# Patient Record
Sex: Female | Born: 1975 | Race: Black or African American | Hispanic: No | Marital: Single | Smoking: Never smoker
Health system: Southern US, Community
[De-identification: ages and names within clinical notes are randomized; demographics above are authoritative.]

## PROBLEM LIST (undated history)

## (undated) DIAGNOSIS — F419 Anxiety disorder, unspecified: Secondary | ICD-10-CM

## (undated) DIAGNOSIS — R519 Headache, unspecified: Secondary | ICD-10-CM

## (undated) DIAGNOSIS — K219 Gastro-esophageal reflux disease without esophagitis: Secondary | ICD-10-CM

## (undated) DIAGNOSIS — F32A Depression, unspecified: Secondary | ICD-10-CM

## (undated) DIAGNOSIS — Z87442 Personal history of urinary calculi: Secondary | ICD-10-CM

## (undated) DIAGNOSIS — B009 Herpesviral infection, unspecified: Secondary | ICD-10-CM

## (undated) DIAGNOSIS — R51 Headache: Secondary | ICD-10-CM

## (undated) DIAGNOSIS — D219 Benign neoplasm of connective and other soft tissue, unspecified: Secondary | ICD-10-CM

## (undated) DIAGNOSIS — J302 Other seasonal allergic rhinitis: Secondary | ICD-10-CM

## (undated) DIAGNOSIS — D649 Anemia, unspecified: Secondary | ICD-10-CM

## (undated) DIAGNOSIS — M1990 Unspecified osteoarthritis, unspecified site: Secondary | ICD-10-CM

## (undated) DIAGNOSIS — F329 Major depressive disorder, single episode, unspecified: Secondary | ICD-10-CM

## (undated) HISTORY — DX: Herpesviral infection, unspecified: B00.9

## (undated) HISTORY — DX: Anxiety disorder, unspecified: F41.9

## (undated) HISTORY — DX: Benign neoplasm of connective and other soft tissue, unspecified: D21.9

## (undated) HISTORY — DX: Major depressive disorder, single episode, unspecified: F32.9

## (undated) HISTORY — DX: Depression, unspecified: F32.A

---
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
## 1999-10-12 ENCOUNTER — Encounter: Payer: Self-pay | Admitting: Internal Medicine

## 1999-10-12 ENCOUNTER — Encounter: Admission: RE | Admit: 1999-10-12 | Discharge: 1999-10-12 | Payer: Self-pay | Admitting: Internal Medicine

## 2000-01-27 ENCOUNTER — Ambulatory Visit (HOSPITAL_COMMUNITY): Admission: RE | Admit: 2000-01-27 | Discharge: 2000-01-27 | Payer: Self-pay | Admitting: Family Medicine

## 2000-01-27 ENCOUNTER — Encounter: Payer: Self-pay | Admitting: Family Medicine

## 2000-02-01 ENCOUNTER — Ambulatory Visit (HOSPITAL_COMMUNITY): Admission: RE | Admit: 2000-02-01 | Discharge: 2000-02-01 | Payer: Self-pay | Admitting: Orthopedic Surgery

## 2000-02-01 ENCOUNTER — Encounter: Payer: Self-pay | Admitting: Orthopedic Surgery

## 2000-02-14 ENCOUNTER — Ambulatory Visit (HOSPITAL_BASED_OUTPATIENT_CLINIC_OR_DEPARTMENT_OTHER): Admission: RE | Admit: 2000-02-14 | Discharge: 2000-02-15 | Payer: Self-pay | Admitting: Orthopedic Surgery

## 2000-02-15 HISTORY — PX: KNEE ARTHROSCOPY W/ ACL RECONSTRUCTION AND PATELLA GRAFT: SHX1861

## 2000-12-10 HISTORY — PX: INDUCED ABORTION: SHX677

## 2001-08-20 ENCOUNTER — Emergency Department (HOSPITAL_COMMUNITY): Admission: EM | Admit: 2001-08-20 | Discharge: 2001-08-21 | Payer: Self-pay | Admitting: Emergency Medicine

## 2001-08-21 ENCOUNTER — Encounter: Payer: Self-pay | Admitting: Emergency Medicine

## 2001-11-27 ENCOUNTER — Other Ambulatory Visit: Admission: RE | Admit: 2001-11-27 | Discharge: 2001-11-27 | Payer: Self-pay | Admitting: Gynecology

## 2002-12-09 ENCOUNTER — Other Ambulatory Visit: Admission: RE | Admit: 2002-12-09 | Discharge: 2002-12-09 | Payer: Self-pay | Admitting: Gynecology

## 2003-02-11 ENCOUNTER — Emergency Department (HOSPITAL_COMMUNITY): Admission: EM | Admit: 2003-02-11 | Discharge: 2003-02-11 | Payer: Self-pay | Admitting: Emergency Medicine

## 2003-02-11 ENCOUNTER — Encounter: Payer: Self-pay | Admitting: Emergency Medicine

## 2003-05-03 ENCOUNTER — Other Ambulatory Visit: Admission: RE | Admit: 2003-05-03 | Discharge: 2003-05-03 | Payer: Self-pay | Admitting: Gynecology

## 2004-04-19 ENCOUNTER — Other Ambulatory Visit: Admission: RE | Admit: 2004-04-19 | Discharge: 2004-04-19 | Payer: Self-pay | Admitting: Gynecology

## 2005-04-26 ENCOUNTER — Other Ambulatory Visit: Admission: RE | Admit: 2005-04-26 | Discharge: 2005-04-26 | Payer: Self-pay | Admitting: Gynecology

## 2006-01-03 ENCOUNTER — Inpatient Hospital Stay (HOSPITAL_COMMUNITY): Admission: AD | Admit: 2006-01-03 | Discharge: 2006-01-05 | Payer: Self-pay | Admitting: Gynecology

## 2006-02-13 ENCOUNTER — Other Ambulatory Visit: Admission: RE | Admit: 2006-02-13 | Discharge: 2006-02-13 | Payer: Self-pay | Admitting: Gynecology

## 2007-03-19 ENCOUNTER — Other Ambulatory Visit: Admission: RE | Admit: 2007-03-19 | Discharge: 2007-03-19 | Payer: Self-pay | Admitting: Gynecology

## 2007-07-10 ENCOUNTER — Emergency Department (HOSPITAL_COMMUNITY): Admission: EM | Admit: 2007-07-10 | Discharge: 2007-07-10 | Payer: Self-pay | Admitting: Emergency Medicine

## 2007-09-09 ENCOUNTER — Emergency Department (HOSPITAL_COMMUNITY): Admission: EM | Admit: 2007-09-09 | Discharge: 2007-09-09 | Payer: Self-pay | Admitting: Emergency Medicine

## 2007-09-25 ENCOUNTER — Emergency Department (HOSPITAL_COMMUNITY): Admission: EM | Admit: 2007-09-25 | Discharge: 2007-09-25 | Payer: Self-pay | Admitting: Emergency Medicine

## 2007-10-22 ENCOUNTER — Emergency Department (HOSPITAL_COMMUNITY): Admission: EM | Admit: 2007-10-22 | Discharge: 2007-10-22 | Payer: Self-pay | Admitting: Emergency Medicine

## 2007-10-23 ENCOUNTER — Encounter (INDEPENDENT_AMBULATORY_CARE_PROVIDER_SITE_OTHER): Payer: Self-pay | Admitting: General Surgery

## 2007-10-23 ENCOUNTER — Observation Stay (HOSPITAL_COMMUNITY): Admission: EM | Admit: 2007-10-23 | Discharge: 2007-10-25 | Payer: Self-pay | Admitting: Emergency Medicine

## 2007-10-23 HISTORY — PX: LAPAROSCOPIC CHOLECYSTECTOMY: SUR755

## 2007-10-29 ENCOUNTER — Ambulatory Visit: Payer: Self-pay | Admitting: Internal Medicine

## 2008-03-31 IMAGING — XA DG ERCP WO/W SPHINCTEROTOMY
9 series · 9 of 9 positions shown · non-contrast
Comparison: none

CLINICAL DATA: Cholecystectomy. Possible common bile duct stone.
 ERCP WITH SPHINCTEROTOMY:

[Series 2: dr · 1 of 1 slices shown (1 of 9)]
[im 1/1]
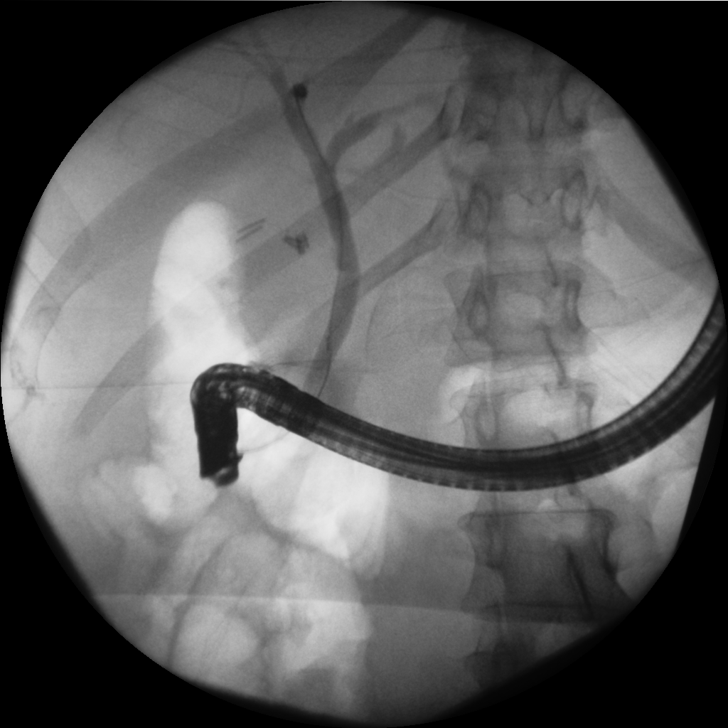

[Series 3: dr · 1 of 1 slices shown (2 of 9)]
[im 1/1]
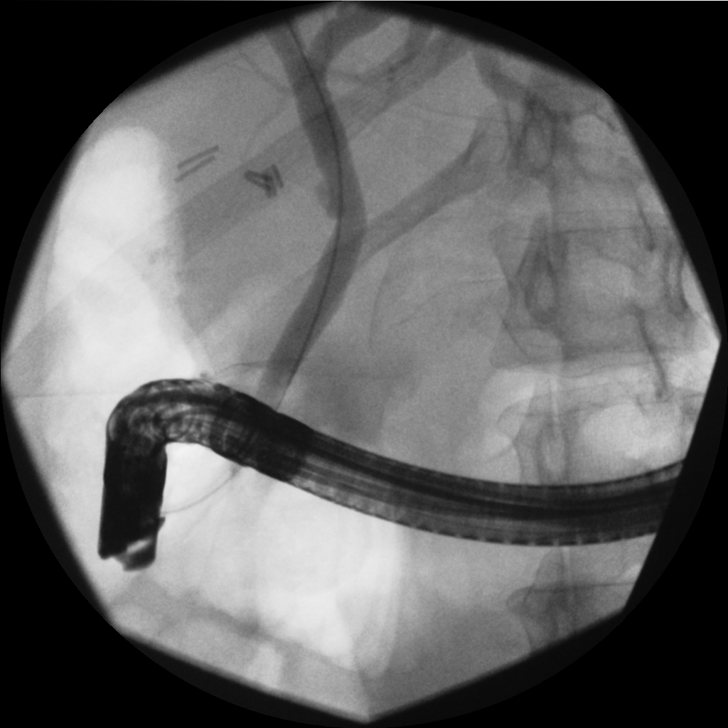

[Series 4: dr · 1 of 1 slices shown (3 of 9)]
[im 1/1]
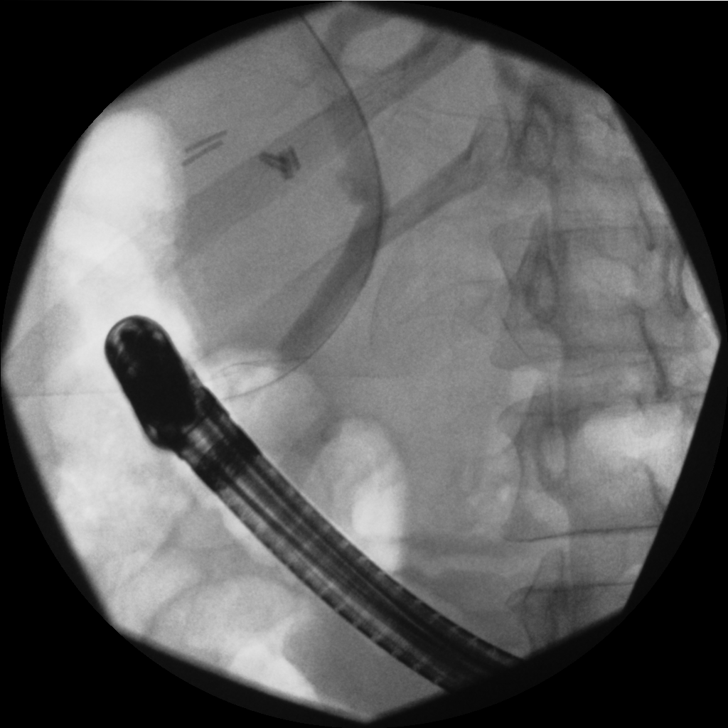

[Series 5: dr · 1 of 1 slices shown (4 of 9)]
[im 1/1]
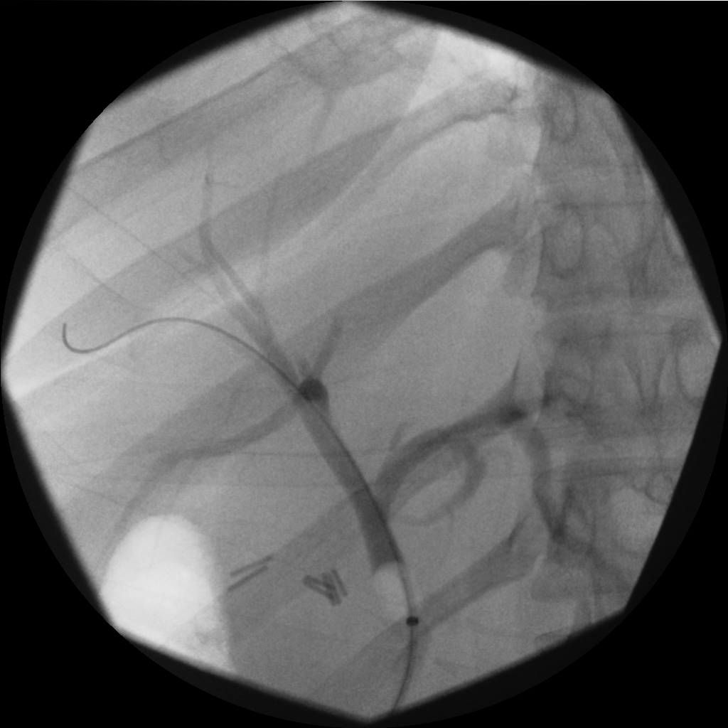

[Series 6: dr · 1 of 1 slices shown (5 of 9)]
[im 1/1]
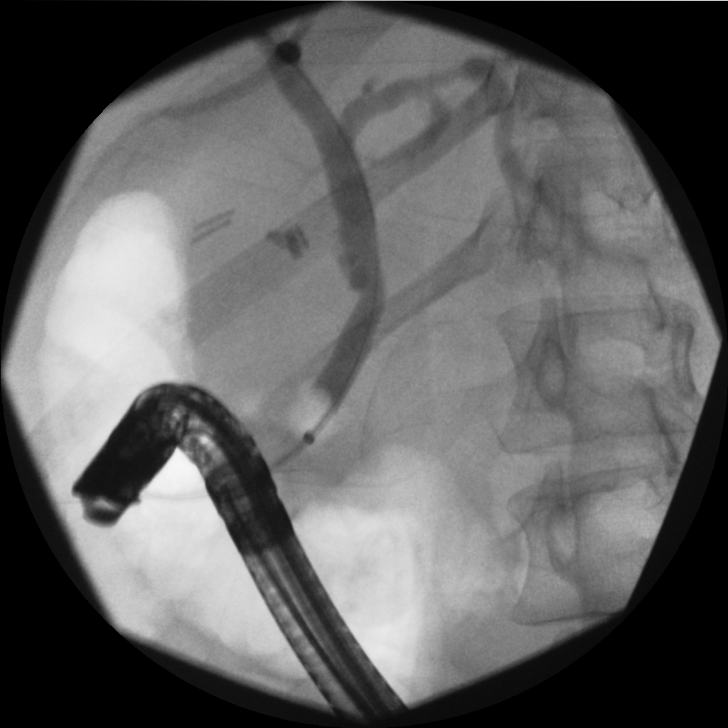

[Series 7: dr · 1 of 1 slices shown (6 of 9)]
[im 1/1]
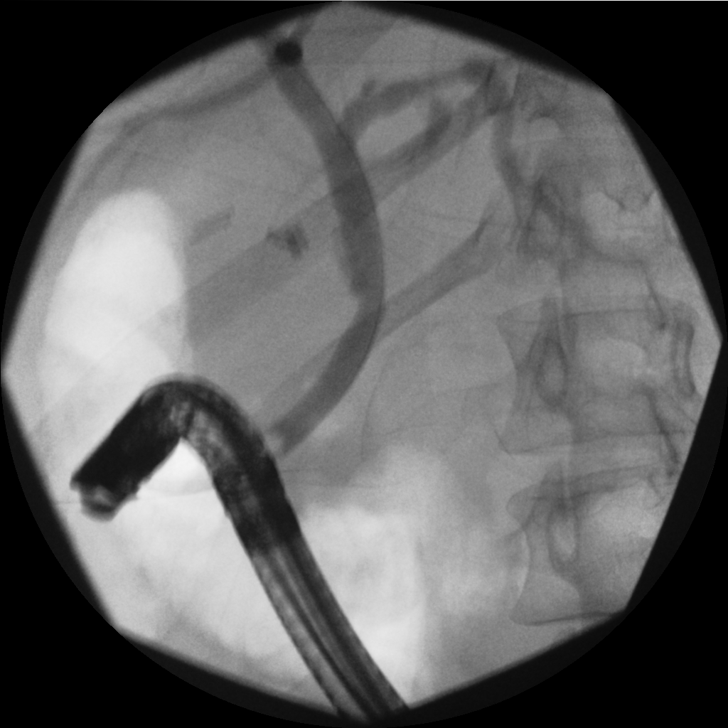

[Series 8: dr · 1 of 1 slices shown (7 of 9)]
[im 1/1]
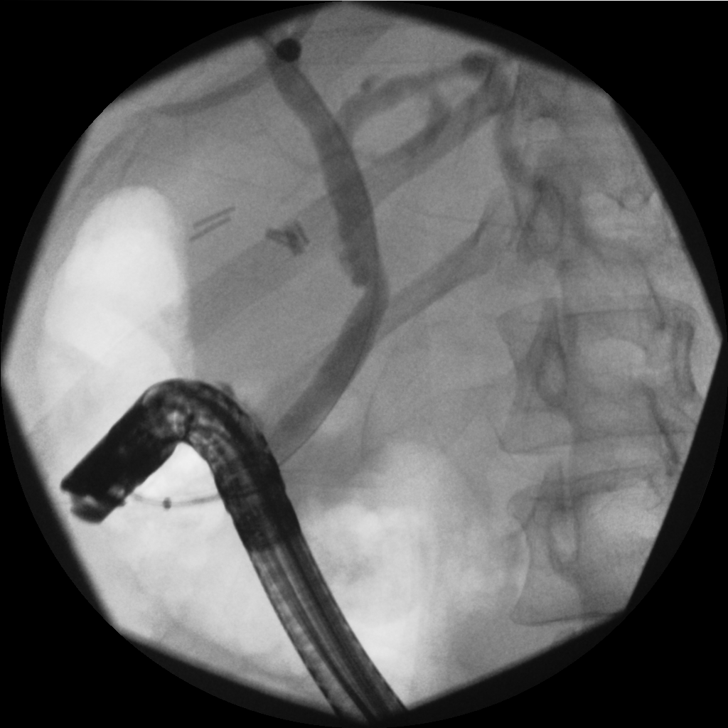

[Series 9: dr · 1 of 1 slices shown (8 of 9)]
[im 1/1]
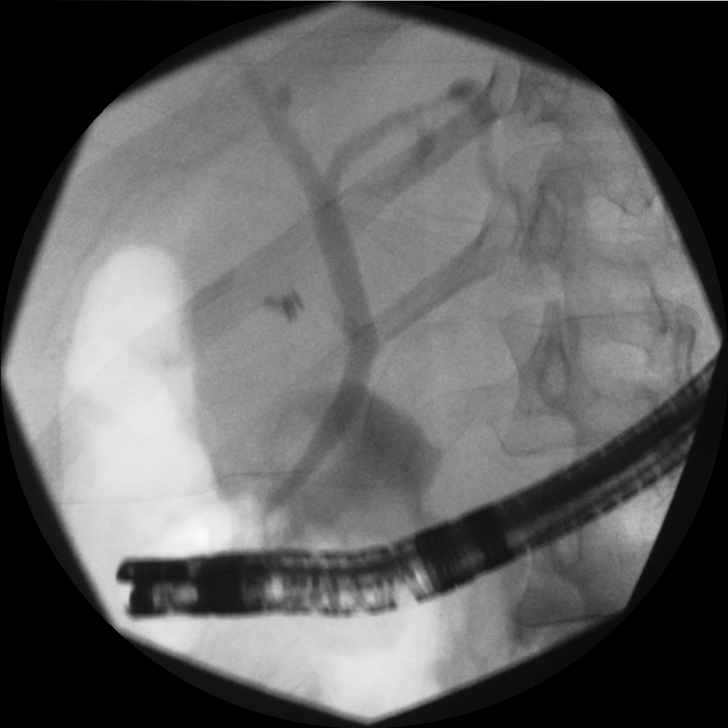

[Series 10: dr · 1 of 1 slices shown (9 of 9)]
[im 1/1]
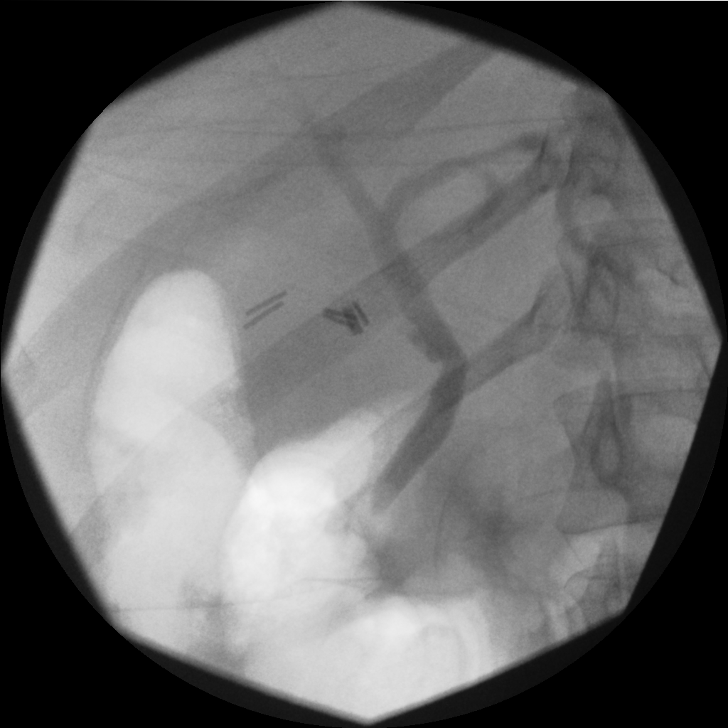

[9 of 9 positions shown; findings below may reference images not displayed]

FINDINGS: Endoscopy was performed by Dr. Andreyx. I was not present for the study.
 Digital C-arm images were obtained. The common bile duct is filled with contrast.  The scope overlies the distal common bile duct. The ducts are nondilated and no filling defects are identified.  Sphincterotomy was performed and a balloon cholangiogram reveals normal biliary caliber without obstruction or filling defect.
IMPRESSION: No definite stones are identified.  Sphincterotomy was performed.

## 2008-04-07 ENCOUNTER — Other Ambulatory Visit: Admission: RE | Admit: 2008-04-07 | Discharge: 2008-04-07 | Payer: Self-pay | Admitting: Gynecology

## 2008-10-12 ENCOUNTER — Ambulatory Visit: Payer: Self-pay | Admitting: Gynecology

## 2008-12-29 ENCOUNTER — Ambulatory Visit: Payer: Self-pay | Admitting: Gynecology

## 2009-02-12 ENCOUNTER — Emergency Department (HOSPITAL_COMMUNITY): Admission: EM | Admit: 2009-02-12 | Discharge: 2009-02-12 | Payer: Self-pay | Admitting: Emergency Medicine

## 2009-04-25 ENCOUNTER — Encounter: Payer: Self-pay | Admitting: Gynecology

## 2009-04-25 ENCOUNTER — Ambulatory Visit: Payer: Self-pay | Admitting: Gynecology

## 2009-04-25 ENCOUNTER — Other Ambulatory Visit: Admission: RE | Admit: 2009-04-25 | Discharge: 2009-04-25 | Payer: Self-pay | Admitting: Gynecology

## 2009-06-24 ENCOUNTER — Ambulatory Visit: Payer: Self-pay | Admitting: Gynecology

## 2009-11-09 ENCOUNTER — Ambulatory Visit: Payer: Self-pay | Admitting: Gynecology

## 2010-05-03 ENCOUNTER — Ambulatory Visit: Payer: Self-pay | Admitting: Gynecology

## 2010-05-03 ENCOUNTER — Other Ambulatory Visit: Admission: RE | Admit: 2010-05-03 | Discharge: 2010-05-03 | Payer: Self-pay | Admitting: Gynecology

## 2010-05-22 ENCOUNTER — Emergency Department (HOSPITAL_COMMUNITY): Admission: EM | Admit: 2010-05-22 | Discharge: 2010-05-22 | Payer: Self-pay | Admitting: Emergency Medicine

## 2010-05-24 ENCOUNTER — Emergency Department (HOSPITAL_COMMUNITY): Admission: EM | Admit: 2010-05-24 | Discharge: 2010-05-24 | Payer: Self-pay | Admitting: Family Medicine

## 2010-07-04 ENCOUNTER — Ambulatory Visit: Payer: Self-pay | Admitting: Gynecology

## 2010-07-12 ENCOUNTER — Ambulatory Visit: Payer: Self-pay | Admitting: Gynecology

## 2010-08-07 ENCOUNTER — Ambulatory Visit: Payer: Self-pay | Admitting: Gynecology

## 2010-09-11 ENCOUNTER — Emergency Department (HOSPITAL_COMMUNITY): Admission: EM | Admit: 2010-09-11 | Discharge: 2010-09-11 | Payer: Self-pay | Admitting: Emergency Medicine

## 2010-11-10 ENCOUNTER — Ambulatory Visit: Payer: Self-pay | Admitting: Gynecology

## 2011-02-21 ENCOUNTER — Ambulatory Visit (INDEPENDENT_AMBULATORY_CARE_PROVIDER_SITE_OTHER): Payer: BC Managed Care – PPO | Admitting: Gynecology

## 2011-02-21 DIAGNOSIS — N898 Other specified noninflammatory disorders of vagina: Secondary | ICD-10-CM

## 2011-02-21 DIAGNOSIS — Z113 Encounter for screening for infections with a predominantly sexual mode of transmission: Secondary | ICD-10-CM

## 2011-02-21 DIAGNOSIS — Z30431 Encounter for routine checking of intrauterine contraceptive device: Secondary | ICD-10-CM

## 2011-02-21 DIAGNOSIS — R82998 Other abnormal findings in urine: Secondary | ICD-10-CM

## 2011-02-21 DIAGNOSIS — B373 Candidiasis of vulva and vagina: Secondary | ICD-10-CM

## 2011-03-22 LAB — COMPREHENSIVE METABOLIC PANEL
ALT: 19 U/L (ref 0–35)
Albumin: 3.4 g/dL — ABNORMAL LOW (ref 3.5–5.2)
Alkaline Phosphatase: 92 U/L (ref 39–117)
BUN: 9 mg/dL (ref 6–23)
CO2: 25 mEq/L (ref 19–32)
Calcium: 8.9 mg/dL (ref 8.4–10.5)
Chloride: 104 mEq/L (ref 96–112)
GFR calc Af Amer: >60 mL/min (ref >60)
Glucose, Bld: 90 mg/dL (ref 70–99)
Total Protein: 7.4 g/dL (ref 6.0–8.3)

## 2011-03-22 LAB — URINALYSIS, ROUTINE W REFLEX MICROSCOPIC
Bilirubin Urine: NEGATIVE
Protein, ur: NEGATIVE mg/dL
Urobilinogen, UA: 0.2 mg/dL (ref 0.0–1.0)
pH: 6 (ref 5.0–8.0)

## 2011-03-29 ENCOUNTER — Emergency Department (HOSPITAL_COMMUNITY): Payer: BC Managed Care – PPO

## 2011-03-29 ENCOUNTER — Emergency Department (HOSPITAL_COMMUNITY)
Admission: EM | Admit: 2011-03-29 | Discharge: 2011-03-29 | Disposition: A | Discharge status: Home / Self Care | Payer: BC Managed Care – PPO | Attending: Emergency Medicine | Admitting: Emergency Medicine

## 2011-03-29 DIAGNOSIS — R071 Chest pain on breathing: Secondary | ICD-10-CM | POA: Insufficient documentation

## 2011-03-29 DIAGNOSIS — Z9089 Acquired absence of other organs: Secondary | ICD-10-CM | POA: Insufficient documentation

## 2011-03-29 DIAGNOSIS — M549 Dorsalgia, unspecified: Secondary | ICD-10-CM | POA: Insufficient documentation

## 2011-03-29 DIAGNOSIS — R10819 Abdominal tenderness, unspecified site: Secondary | ICD-10-CM | POA: Insufficient documentation

## 2011-03-29 LAB — POCT PREGNANCY, URINE: Preg Test, Ur: NEGATIVE

## 2011-03-29 LAB — URINALYSIS, ROUTINE W REFLEX MICROSCOPIC
Glucose, UA: NEGATIVE mg/dL
Hgb urine dipstick: NEGATIVE
Nitrite: NEGATIVE
Specific Gravity, Urine: 1.026 (ref 1.005–1.030)
Urobilinogen, UA: 0.2 mg/dL (ref 0.0–1.0)
pH: 5.5 (ref 5.0–8.0)

## 2011-04-24 NOTE — Consult Note (Signed)
NAME:  TOM, RAGSDALE            ACCOUNT NO.:  [id number]   MEDICAL RECORD NO.:  [id number]          PATIENT TYPE:  OBV   LOCATION:  1538                         FACILITY:  [facility]   PHYSICIAN:  Lennie Muckle, MD      DATE OF BIRTH:  [birth year]   DATE OF CONSULTATION:  10/23/2007  DATE OF DISCHARGE:                                 CONSULTATION   Ms. Garraway is a 35 year old female whom I am seeing by request by Dr.  Preston Fleeting for cholelithiasis and biliary colic.  She is a 35 year old  female, who was known to have cholelithiasis and scheduled for surgery  next Friday with Dr. Marcille Blanco.  Apparently, she had abrupt onset of  abdominal pain yesterday on November 12, was seen in the emergency  department and had resolution of her pain and was discharged home.  She  then ate chicken wings last night and reported to the emergency room at  3:00 in the morning due to severe amount of epigastric discomfort with  radiation of her pain to her back and shoulder blade.  She described  this pain as cramping and sharp.  This has some nausea and vomiting with  this recent episode.  She states she did try to modify her diet and was  watching what she ate but had the episode previously on November 12.  During this recent visit this morning to the emergency room, she did  receive IV Dilaudid, Reglan, and Protonix with almost complete  resolution of her pain.  She would like to see about moving her  operative date forward in order to facilitate removal of her  gallbladder.  She did have labs drawn in the emergency department with  elevation in her liver enzymes.  AST is 299, ALT 144, alkaline  phosphatase 245, and bilirubin is 1.6.  Previous labs drawn from October  8 were essentially within normal limits.   PAST MEDICAL HISTORY:  Migraines and question gastritis.   PAST SURGICAL HISTORY:  Right ACL repair.   MEDICATIONS:  Yasmin, Topamax, and Vicodin.   ALLERGIES:  None.   REVIEW OF SYSTEMS:  Per  the patient and the patient's emergency room  chart which are negative other than her HPI.   PHYSICAL EXAM:  She is a pleasant well-developed young female lying in  stretcher, no acute distress.  Temperature is 97, blood pressure 114/77, pulse 79.  HEENT:  Extraocular muscles are intact.  No scleral icterus is evident.  CHEST:  Clear to auscultation bilaterally.  CARDIOVASCULAR:  Regular rate and rhythm.  ABDOMEN:  Soft, nondistended, minimally tender to palpation in the right  upper quadrant epigastric region.  EXTREMITIES:  Without edema.  SKIN:  No rashes and warm to the touch.   Labs as previously dictated.   ASSESSMENT AND PLAN:  Cholelithiasis with episode of biliary colic,  likely passed a small stone over the past 24 hours.  There is currently  no evidence of acute cholecystitis.  I would like to repeat a liver  panel to ensure that her labs are trending down and that episode is  resolving.  I  attempted to discuss with Dr. Corliss Skains and other partners to  possibly move up her surgery date.  There is no acute need to perform  cholecystectomy; however, she would like to move her surgery date up in  order to refrain from having other episodes of biliary colic.  If her  labs are trending  upward, we may have to have an admission with gastrointestinal consult  for possible ERCP.  Otherwise, we will hopefully be able to discharge  from the emergency room, ensuring her pain is well controlled with  surgery scheduled in the near future.      Lennie Muckle, MD  Electronically Signed     ALA/MEDQ  D:  10/23/2007  T:  10/23/2007  Job:  [id number]

## 2011-04-24 NOTE — Op Note (Signed)
Tracy Bowen, Tracy Bowen            ACCOUNT NO.:  [id number]   MEDICAL RECORD NO.:  [id number]          PATIENT TYPE:  INP   LOCATION:  1538                         FACILITY:  [facility]   PHYSICIAN:  Anselm Pancoast. Weatherly, M.D.DATE OF BIRTH:  Jul 19, 1976   DATE OF PROCEDURE:  10/23/2007  DATE OF DISCHARGE:                               OPERATIVE REPORT   PREOPERATIVE DIAGNOSIS:  Chronic cholecystitis with stones and probable  common duct stone.   POSTOPERATIVE DIAGNOSIS:  Chronic subacute cholecystitis with probable  stone in the distal common bile duct.   OPERATION:  Laparoscopic cholecystectomy with cholangiogram.   SURGEON:  Anselm Pancoast. Zachery Dakins, M.D.   ASSISTANT:  Thornton Park. Daphine Deutscher, MD.   ANESTHESIA:  General.   HISTORY:  Tracy Bowen is a 35 year old female who was seen here in  the emergency room approximately a month ago with episodes of epigastric  pain.  She had had previous episodes of epigastric pain that was  described as reflux.  This time she was having more intense pain and  they evaluated with normal liver function studies, a CBC and then an  ultrasound that showed a number of stones in her gallbladder.  She was  referred to our office and saw Dr. Manus Rudd.  This about 10 days  ago and was  tentatively schedule for laparoscopic cholecystectomy and  cholangiogram on October 31, 2007.  She, however, this morning after  eating chicken wings last evening, had severe episode of epigastric pain  radiating to her back and after the pain persisted.  She came to the  emergency room approximately 1:00 a.m.  The ER physician saw her and  called Lennie Muckle, MD, who was on-call, who came in and saw the  patient.  She had abnormal liver function studies, had a normal white  count and Dr. Freida Busman requested that they repeat the bilirubin and liver  function studies.  This confirmed that there were definitely abnormal.  The bilirubin was 1.8 and the patient had received IV  narcotics for  pain.  Dr. Freida Busman requested that I see the patient since she had other  surgery schedule this morning.  I saw the patient.  She was still having  discomfort and I discussed with her that I feared that she had a common  duct stone, but would recommend that we proceed on with the laparoscopic  cholecystectomy and do a cholangiogram and she possibly or probably  would need an ERCP postoperatively.  The patient was in agreement with  this and was given 3 grams the Unasyn, has PAS stockings and finally got  to the operating room about 5:30 p.m.   The abdomen was prepped after induction of general anesthesia,  endotracheal tube, oral tube to the stomach and then she was draped in  sterile manner.  A small incision was made below the umbilicus.  The  fascia was identified, picked up between two Kochers and very carefully  opened into the peritoneal cavity.  Pursestring suture of 0 Vicryl was  placed and the Hassan cannula introduced.  The gallbladder was  distended.  It was not acutely  inflamed but quite tense and the upper 10  mL trocar was placed under direct vision after anesthetizing the fascia  and subxiphoid area, two lateral 5 mm trocars were placed.  The  gallbladder was grasped, retracted upward and outward.  There was marked  adhesions, both chronic and acute in the proximal portion of the  gallbladder and this very carefully was dissected away from the  gallbladder going down and I could identify the fundus of the  gallbladder and the cystic duct junction and also the cystic artery.  I  doubly clipped the cystic artery proximal, single, distally, did not  divide, placed a clip flush with the junction of the common cystic duct  and gallbladder and made a little x-ray proximally.  There was  significant bile and kind of stone debris, black, not really a true  stone in the cystic duct and then a Cook catheter was introduced and  held in place with clip.  I did an x-ray and  the x-ray shows that the  intrahepatic radicles filled both left and right but there is very  little flow going to the distal portion of the common bile duct.  We  then repositioned her.  The upper 10 mL trocar was kind of partially  occluding the view and after repositioning, etc. Did another injection  and this time you could see the dye going in the proximal portion of the  cystic duct and going down to the ampulla area, but there was really no  flow going into the duodenum.  I then gave her on 1 mg of glucagon,  waited about 5 minutes and repeated the x-ray and this time we could see  just a little dye going into the duodenum and it looks like there is  about a 2 mm stone right at the distal portion of the common bile duct  but the stone gives [place] meniscus but not really the obvious stone.  Dr.  Daphine Deutscher had scrubbed in and reviewed the x-rays and was in agreement with  this and I think what we will do is not try to manipulate with a biliary  Fogarty or anything but just get an x-ray, ERCP if her liver tests are  not back to normal in the a.m.  Dr. Daphine Deutscher was in agreement with this  and we are still waiting for the official reading of the radiologist,  even though this x-ray was completed over 30 minutes ago.  The cystic  duct catheter was removed.  I put three clips on the proximal cystic  duct, divided it and went ahead and divided the cystic artery that had  been previously clipped and then freed the gallbladder from its bed.  The gallbladder was placed in the EndoCatch bag and brought out through  the umbilical port.  We had irrigated, aspirated and could see the  clips, were comfortable that we got good hemostasis and then put a  figure-of-eight suture of 0 Vicryl in addition to the pursestring in the  umbilicus that I had previously placed and anesthetize it for about 10  mL of Marcaine.  The irrigating fluid was aspirated.  No evidence of any  significant bleeding.  The carbon  dioxide released.  The 5 mL ports  withdrawn under direct vision and the carbon dioxide completely released  and the upper 10 trocar withdrawn.  The patient tolerated procedure  well.  The subcutaneous wounds were closed with 4-0 Vicryl.  Benzoin and  Steri-Strips on the  skin.  We will get a set of liver function studies  in the morning and unless these are definitely returning back to normal  and the patient completely asymptomatic, feel that she will need an ERCP  tomorrow.           ______________________________  Anselm Pancoast. Zachery Dakins, M.D.     WJW/MEDQ  D:  10/23/2007  T:  10/24/2007  Job:  [id number]

## 2011-04-24 NOTE — Discharge Summary (Signed)
NAMETASHONNA, Tracy Bowen            ACCOUNT NO.:  [id number]   MEDICAL RECORD NO.:  [id number]          PATIENT TYPE:  INP   LOCATION:  1538                         FACILITY:  [facility]   PHYSICIAN:  Lennie Muckle, MD      DATE OF BIRTH:  1976/09/04   DATE OF ADMISSION:  10/23/2007  DATE OF DISCHARGE:  10/25/2007                               DISCHARGE SUMMARY   Ms. Comins is a 35 year old female who came in with cholelithiasis and  choledocholithiasis.  She was taken to the operating room by Dr. Consuello Bossier on October 23, 2007, for laparoscopic cholecystectomy.  Intraoperative cholangiogram showed some minimal flow into the duodenum  with a probable common bile duct stone.  She did receive a GI consult  with ERCP performed on October 24, 2007.  Stone fragments were  retrieved by Dr. Arlyce Dice.  Postprocedure, she is doing well, having no  significant amounts of abdominal pain, has had clear liquids without  difficulty, is afebrile.  Her incisions are without evidence of  infection.  Abdomen is soft, appropriately tender.  She is scheduled to  eat a low-fat diet this morning and, we will allow her to go home if she  is able to tolerate her diet.  She has been given Percocet for pain and  instructed to take over-the-counter stool softeners to aid with  constipation.  No heavy lifting greater than 20 pounds for 2 weeks.  She  will follow up with Dr. Zachery Dakins in approximately 2 or 3 weeks' time.  She is instructed to wash her incisions daily with soap and water.  No  soaking in bathtub and may eat a regular diet.      Lennie Muckle, MD  Electronically Signed     ALA/MEDQ  D:  10/25/2007  T:  10/26/2007  Job:  [id number]

## 2011-04-24 NOTE — H&P (Signed)
NAMEEMONY, DORMER            ACCOUNT NO.:  [id number]   MEDICAL RECORD NO.:  [id number]          PATIENT TYPE:  OBV   LOCATION:  1538                         FACILITY:  [facility]   PHYSICIAN:  Anselm Pancoast. Weatherly, M.D.DATE OF BIRTH:  [birth year]   DATE OF ADMISSION:  10/23/2007  DATE OF DISCHARGE:                              HISTORY & PHYSICAL   CHIEF COMPLAINT:  Epigastric pain.   HISTORY:  Tracy Bowen is a 35 year old black female who presented  to the ER early a.m. complaining of severe pain after eating chicken  wings earlier last evening.  She has known gallstones.  She was seen  here in the emergency room in October with the gallstones identified.  Her liver function studies were normal at that time and she saw Dr. Fannie Knee  in the office and is tentatively scheduled for surgery on November 21.  She was advised on a low-fat diet, frequent feeding, etc. and she did  not followed this.  With the episode of pain which is epigastric area  kind of going to the upper abdomen and back, she had a mildly abnormal  liver function studies and was seen in the early a.m. by Dr. Freida Busman who  was on call.  Repeat liver function studies definitely show a bilirubin  1.8, SGOT, PT and alkaline phosphatase all abnormal.  She has received  narcotics.  Her pain is definitely not worse than it was earlier and  actually has possibly subsided, but since she has got known gallstones  and has had several attempts here to the ER I think she ought to be  placed on the OR schedule, and the possibility of ERCP is definitely  possible.  The patient is in agreement with this.  She has a past  history of migraine headaches and she is on birth control pills.  She  does not smoke, does not use alcohol   PHYSICAL EXAMINATION:  She is a pleasant, fairly large black female in  no acute distress now.  She has an IV in the right arm, temperature was  97.  Her pulse 79, respirations 17, blood pressure is 114/77.   She is  not acutely ill and she does not appear jaundiced as far as her sclerae.  Her mucous membranes normal.  There is no pharyngitis  LUNGS:  Clear.  CARDIAC:  Normal sinus rhythm.  Did not her breast exam.  ABDOMEN:  She is not acutely tender.  She kind of localizes the pain in  the epigastric area.  She is not tender in the lower abdomen and she is  not throwing up at this time.  EXTREMITIES:  Unremarkable.  CNS: Physiologic.   We will get her admitted and plan to add her to the OR schedule.  It  looks like it is going to be late this evening.   IMPRESSION:  Chronic cholecystitis with possibly common duct stone and  an acute episode of biliary colic past evening.  Her liver tests were  not abnormal when she was seen in the emergency room back in October.  ______________________________  Anselm Pancoast. Zachery Dakins, M.D.     WJW/MEDQ  D:  10/23/2007  T:  10/23/2007  Job:  [id number]

## 2011-04-27 NOTE — Op Note (Signed)
[facility]  Patient:    Tracy Bowen, Tracy Bowen                   MRN: [id number] Proc. Date: 02/15/00 Adm. Date:  [id number] Attending:  Burnard Bunting                           Operative Report  PREOPERATIVE DIAGNOSIS:  Right knee anterior cruciate ligament tear, partial tear posterior horn lateral meniscal.  POSTOPERATIVE DIAGNOSIS:  Right knee anterior cruciate ligament tear, partial tear posterior horn lateral meniscal.  PROCEDURE:  Right knee anterior cruciate ligament reconstruction using epsilateral bone patellar tendon bone graft.  SURGEON:  Graylin Shiver. August Saucer, M.D.  ASSISTANT:  Veverly Fells. Ophelia Charter, M.D.  ANESTHESIA:  General endotracheal.  ESTIMATED BLOOD LOSS:  25 cc.  DRAINS:  Hemovac x 1.  TOURNIQUET TIME:  Approximately 2 hours and 10 minutes at 300 mmHg.  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 Examination and MRI scan consistent with MRI tear.  Patient has symptomatic instability to he knee.  OPERATIVE FINDINGS: 1. Examination under anesthesia, range of motion 3 degrees hyperextension to 130    degrees of flexion with mild effusion.  Excellent patella mobility two quadrants    medially and laterally.  Intact MCL and LCL distressed at both 0 and 30 degrees.    Intact PCL with 6 mm step-off, Lachman with anterior drawer about 4 mm with o    end point and positive pivot-shift. 2. Diagnostic and operative arthroscopy:    - Intact patellofemoral compartment with some grade 1-2 chondromalacia over  proximal and lateral pole of her patella over a 5 x 5 mm area.  Trochlea      entire articular cartilage was intact.    - Intact medial compartment with intact meniscus and articular cartilage.    - Mid substance tear of the ACL, intact PCL.    - Intact lateral compartment with partial thickness tear of posterior horn  lateral  meniscus, which was stable and did not displace into the joint with      probing.  PROCEDURE IN DETAIL:  Patient was brought to the operating room, where general endotracheal anesthesia was induced.  Preoperative IV antibiotics were administered.  Patients right and left legs were examined under anesthesia.  A bump was placed under the right hip and the patient was properly positioned. The right leg was then sprayed with Betadine over the knee, prepped with Duraprep solution and draped in a sterile manner.  A proximal right thigh tourniquet was  placed prior to prepping and draping.  Tracy Bowen was used to cover the operative field. Leg was elevated and exsanguinated with the Esmarch wrap and tourniquet was inflated and incision was made from the inferior pole of the patella to the tibia tubercle.  Skin and subcutaneous tissue were sharply divided.  Paratenon was divided and developed has a separate plane for lateral closure.  The patients patella width measured about 33-34 mm and thus central third patellar tendon measuring 10 mm with double wide knife was harvested.  This was facilitated with the use of oscillating saw and 1/4 inch curved osteotome.  Patella defect was bone grafted.  The patella tendon was closed loosely using interrupted 0 Vicryl figure-of-eight suture inverted.  Paratenon was then reapproximated halfway down the patella.  Soft tissue was then  raised over the proximal/medial tibia in order to facilitate placement of the tibial bone tunnel.  One #5 Ethibond was placed through the nose of what would be the femoral bone plug.  Three #5 Ethibonds were placed through what would be the bone plug on the tibia.  The anterior/inferior  lateral portal and anterior/inferior medial portals were then established. Anterior/inferior medial portal was established under direct visualization. Systemic  examination of the knee was performed.  Suprapatellar pouch was free f loose  bodies.  The medial and lateral gutters had a few very small cartilage flakes in them, which were washed out.  The medial compartment had intact articular cartilage and intact meniscus.  ACL had mid substance tear.  Lateral compartment demonstrated a 5 x 7 mm area of denuded cartilage about grade 1 in thickness over the anterior/lateral femoral condyle in the region of the bone bruise.  There was also a partial thickness tear posterior horn lateral meniscus, which was posterior to the popliteus tendon.  The tear did not displace into the joint.  At this time, the mid substance tear of the ACL was identified and the ACL itself was debrided. The over-the-top position in the back was identified.  Tibial drill guide was then set at 45 degrees and placed onto the tibia.  Guide pin was placed and a 10 mm tunnel was drilled with an acorn reamer.  Guide pin was then placed near the back wall of the femur and a 10 mm tunnel was drilled here with about a 2 mm back wall ensured.  Beef tendon was passed.  The graft was then passed into the knee. Adequate notch plasty was performed, changing the A-shaped notch to a U-shaped notch and removing part of the lateral wall to prevent possible impingement. The graft was passed.  It was then secured with a 7 x 23 mm bioabsorbable interferon screw on the femur.  Anesthesia check was positive for secure fixation.  Knee was taken through a range of motion and was found not to impinge on the lateral or superior notch.  Then secured to the femur using a 9 x 23 mm interferon screw placed on the posterior aspect of the bone block.  The two sutures were then tied over a post distal to the tibial tunnel.  The knee was taken through a range of  motion and the graft tension was rechecked and found to be excellent.  The Lachman was negative.  Incision and the knee joint were then thoroughly irrigated. Consideration was given to repairing the lateral meniscus,  but since it was posterior to the popliteus and was only partial thickness, it was decided to leave this meniscal tear alone.  A Hemovac drain was placed.  The incision was the closed  using 0 Vicryl to repair the portals.  The paratenon was then closed using 0 Vicryl figure-of-eight suture.  Soft tissue coverage was achieved over the tibia tunnel. The skin and subcutaneous tissue was then closed using interrupted 2-0 Vicryl suture, followed by a running 3-0 pullout Prolene.  Ten cc of Marcaine and morphine were placed into the knee joint prior to closure.  The patient was then placed n a bulky knee dressing and knee immobilizer.  The patient tolerated the procedure ell without immediate complications.  Pulse was palpable at the conclusion of the case. DD:  02/15/00 TD:  02/15/00 Job: [id number]

## 2011-04-27 NOTE — H&P (Signed)
Tracy Bowen, Tracy Bowen            ACCOUNT NO.:  [id number]   MEDICAL RECORD NO.:  [id number]          PATIENT TYPE:  INP   LOCATION:  9166                          FACILITY:  [facility]   PHYSICIAN:  Juan H. Lily Peer, M.D.DATE OF BIRTH:  09-20-1976   DATE OF ADMISSION:  01/03/2006  DATE OF DISCHARGE:                                HISTORY & PHYSICAL   CHIEF COMPLAINT:  1.  Post date pregnancy 41-week gestation.  2.  Contractions.   HISTORY:  The patient is a 35 year old gravida 3, para 0, AB2 with a  corrected date of estimated confinement of December 27, 2005 current [redacted] weeks  gestation.  Patient was previously scheduled to be admitted on the evening  of January 28 for induction secondary to post dates, but called last night  as she was having a lot of lower abdominal pressure and contractions.  She  was placed on the monitor and was found to be contracting every four to six  minutes apart and occasional variable decelerations.  She was admitted.  This morning underwent artificial rupture of membranes.  Cervix was 2 and  70% effaced, -3 station.  Clear fluid, although minimal.  Fetal scalp  electrode and IUPC were placed and she will be started on Amnioinfusion  secondary to the mild variables.  Patient's GBS status:  Her GBS culture is  positive.  She will be started on pen G for prophylaxis.  Her prenatal  course significant for the fact that she had declined cystic fibrosis screen  as well as first trimester screen and declined alpha fetoprotein.  Patient  had a hemoglobin electrophoresis that she had slightly elevated hemoglobin  S.  She had bacterial vaginosis in the first trimester and was treated  accordingly.   PAST MEDICAL HISTORY:  Patient denies any allergies.  She denies any other  medical problems.  She has had two elective ABs in 1997 and 2002,  respectively.   REVIEW OF SYSTEMS:  See [place] form.   PHYSICAL EXAMINATION:  VITAL SIGNS:  Patient's blood pressure  initially when  she came in was 142/92, is now 139/65 and she was afebrile.  HEENT:  Unremarkable.  NECK:  Supple.  Trachea midline.  No carotid bruits.  No thyromegaly.  LUNGS:  Clear to auscultation without any rhonchi or wheezes.  HEART:  Regular rate and rhythm.  No murmurs or gallops.  BREASTS:  Not done.  ABDOMEN:  Gravid uterus.  Vertex presentation by [facility] maneuver.  PELVIC:  Cervix 2 cm, 70% effaced, -3 station.  EXTREMITIES:  DTRs 2+.  Trace edema.  No clonus.   PRENATAL LABORATORIES:  Patient declined cystic fibrosis, alpha fetoprotein,  and first trimester screen.  Blood type O+.  Negative antibody screen.  VDRL  was nonreactive.  Rubella immune.  Hepatitis B surface antigen, HIV were  negative.  Diabetes screen was normal and GBS culture was positive.   ASSESSMENT:  Patient is a 35 year old gravida 3, para 2 at [redacted] weeks  gestation.  Had been seen yesterday in the office thinking that she was in  labor.  Reassuring, but not a true reactive  fetal heart rate tracing was  noted so a biophysical profile had been done which was 8/8 and the  ultrasound demonstrated vertex presentation and estimated fetal weight was  recorded 7 pounds 9 ounces.  She was admitted early this morning complaining  of contractions.  She had occasional variable decelerations and she was  started on Amnioinfusion, started with 300 mL of lactated Ringer's followed  by 75 mL/hour.  Her contractions were every three to five minutes apart.  Will augment depending if protracted labor.  Since she is GBS positive she  will be given 5 million units of pen G followed by 2.5 million units q.4h.  Will continue to monitor closely and manage accordingly.  Of note, when she  came in her blood pressure had been elevated.  Pregnancy induced  hypertension laboratories on admission LDH, uric acid, SGOT, SGPT, and LDH  were all normal and her platelet count was 174,000.  Her current blood  pressure was 139/65.  Will  continue to monitor closely.   PLAN:  As per assessment above.      Juan H. Lily Peer, M.D.  Electronically Signed     JHF/MEDQ  D:  01/03/2006  T:  01/03/2006  Job:  [id number]

## 2011-04-27 NOTE — Op Note (Signed)
[place]  Patient:    Tracy Bowen, HARTS                   MRN: [id number] Proc. Date: 02/16/00 Attending:  Burnard Bunting                           Operative Report  PREOPERATIVE DIAGNOSIS:  Retained drain, right knee.  POSTOPERATIVE DIAGNOSIS:  Retained drain, right knee.  PROCEDURE:  Removal of drain, right knee.  SURGEON/ATTENDING PHYSICIAN:  Cammy Copa, M.D.  ANESTHESIA:  General endotracheal.  ESTIMATED BLOOD LOSS:  10 cc.  INDICATIONS:  Tracy Bowen is a patient who had ACL reconstruction performed one day before the current procedure.  Attempts to remove the drain on the morning after surgery were unsuccessful and were causing significant pain to the patient. She was brought to the operating room to have this drain removed.  PROCEDURE IN DETAIL:  The patient was brought to the operating room, where general endotracheal anesthesia was induced.  IV antibiotics were administered. Attempts were made to remove the drain from the superolateral port of the right knee. These attempts were unsuccessful.  The patients right leg and foot were then prepped with DuraPrep and draped in a sterile manner.  Fluoroscopy was then utilized, and it showed a portion of the drain remaining in the center portion of the knee. Before prepping and draping the leg, an attempt was made to remove the drain, and only part of it came out.  The leg was then prepped and draped in a sterile manner. Fluoroscopy did confirm that part of the drain did remain in the fat pad region of the knee.  Her prior incision was then opened in the proximal portion, and the drain was identified, kinked within the fat pad.  Blunt dissection was utilized, and although I looked carefully for a suture adhering the drain to the tissue,  could not find any.  Nonetheless, because of the difficulty in removing the drain, a kink in the drain, or something, must have  been tethering it in position. Nonetheless, the drain was removed.  Fluoroscopy confirmed complete removal of he drain from the joint.  There was a kink in the drain.  The joint itself was then irrigated through that lateral portal within the incision using 2 L of saline.  This was then closed using #0 Vicryl figure-of-eight suture.  The tissue above he patellar tendon closure and below the incision was then irrigated using another  liter of irrigating solution.  The incision was then closed back as it was prior using interrupted 2-0 Vicryl suture and the running 3-0 pull-out Prolene.  The stability was excellent with 0-1 mm anterior translation and firm end-point. Steri-Strips were applied, and a bulky dressing was then placed.  The patient tolerated the procedure well without immediate complications.  The tourniquet was not used. DD:  02/16/00 TD:  02/17/00 Job: [id number]

## 2011-05-09 ENCOUNTER — Encounter (INDEPENDENT_AMBULATORY_CARE_PROVIDER_SITE_OTHER): Payer: BC Managed Care – PPO | Admitting: Gynecology

## 2011-05-09 ENCOUNTER — Other Ambulatory Visit: Payer: Self-pay | Admitting: Gynecology

## 2011-05-09 ENCOUNTER — Other Ambulatory Visit (HOSPITAL_COMMUNITY)
Admission: RE | Admit: 2011-05-09 | Discharge: 2011-05-09 | Disposition: A | Discharge status: Home / Self Care | Payer: BC Managed Care – PPO | Source: Ambulatory Visit | Attending: Gynecology | Admitting: Gynecology

## 2011-05-09 DIAGNOSIS — Z30431 Encounter for routine checking of intrauterine contraceptive device: Secondary | ICD-10-CM

## 2011-05-09 DIAGNOSIS — Z13220 Encounter for screening for lipoid disorders: Secondary | ICD-10-CM

## 2011-05-09 DIAGNOSIS — Z833 Family history of diabetes mellitus: Secondary | ICD-10-CM

## 2011-05-09 DIAGNOSIS — N898 Other specified noninflammatory disorders of vagina: Secondary | ICD-10-CM

## 2011-05-09 DIAGNOSIS — B373 Candidiasis of vulva and vagina: Secondary | ICD-10-CM

## 2011-05-09 DIAGNOSIS — Z124 Encounter for screening for malignant neoplasm of cervix: Secondary | ICD-10-CM | POA: Insufficient documentation

## 2011-05-09 DIAGNOSIS — Z01419 Encounter for gynecological examination (general) (routine) without abnormal findings: Secondary | ICD-10-CM

## 2011-05-09 DIAGNOSIS — L293 Anogenital pruritus, unspecified: Secondary | ICD-10-CM

## 2011-05-09 DIAGNOSIS — R809 Proteinuria, unspecified: Secondary | ICD-10-CM

## 2011-08-14 ENCOUNTER — Other Ambulatory Visit: Payer: Self-pay | Admitting: Gynecology

## 2011-08-31 ENCOUNTER — Other Ambulatory Visit: Payer: Self-pay | Admitting: Women's Health

## 2011-08-31 DIAGNOSIS — B009 Herpesviral infection, unspecified: Secondary | ICD-10-CM

## 2011-09-18 LAB — COMPREHENSIVE METABOLIC PANEL
ALT: 142 — ABNORMAL HIGH
ALT: 144 — ABNORMAL HIGH
AST: 299 — ABNORMAL HIGH
Albumin: 2.7 — ABNORMAL LOW
Alkaline Phosphatase: 237 — ABNORMAL HIGH
Alkaline Phosphatase: 245 — ABNORMAL HIGH
BUN: 2 — ABNORMAL LOW
Chloride: 105
Potassium: 3.6
Potassium: 4.1
Total Bilirubin: 1.6 — ABNORMAL HIGH
Total Bilirubin: 1.9 — ABNORMAL HIGH
Total Protein: 8.4 — ABNORMAL HIGH

## 2011-09-18 LAB — URINE MICROSCOPIC-ADD ON

## 2011-09-18 LAB — URINALYSIS, ROUTINE W REFLEX MICROSCOPIC
Glucose, UA: NEGATIVE
pH: 7

## 2011-09-18 LAB — DIFFERENTIAL
Basophils Relative: 1
Eosinophils Absolute: 0.1 — ABNORMAL LOW
Monocytes Absolute: 0.5
Monocytes Relative: 8

## 2011-09-18 LAB — CBC
HCT: 39.8
Hemoglobin: 13.3
MCHC: 33.3
MCV: 82.6
RBC: 4.82
WBC: 6.3

## 2011-09-18 LAB — ALT: ALT: 140 — ABNORMAL HIGH

## 2011-09-18 LAB — BILIRUBIN, TOTAL: Total Bilirubin: 1.8 — ABNORMAL HIGH

## 2011-09-19 LAB — CBC
HCT: 35.9 — ABNORMAL LOW
Hemoglobin: 11.8 — ABNORMAL LOW
MCHC: 32.9
MCV: 82.8
Platelets: 267
RBC: 4.34
RDW: 14.2 — ABNORMAL HIGH
WBC: 7.5

## 2011-09-19 LAB — URINALYSIS, ROUTINE W REFLEX MICROSCOPIC
Bilirubin Urine: NEGATIVE
Hgb urine dipstick: NEGATIVE
Ketones, ur: NEGATIVE
Protein, ur: NEGATIVE
Urobilinogen, UA: 0.2

## 2011-09-19 LAB — COMPREHENSIVE METABOLIC PANEL WITH GFR
ALT: 16
AST: 24
Albumin: 3.1 — ABNORMAL LOW
Alkaline Phosphatase: 99
BUN: 4 — ABNORMAL LOW
CO2: 24
Calcium: 9.1
Chloride: 102
Creatinine, Ser: 1
GFR calc non Af Amer: >60
Glucose, Bld: 102 — ABNORMAL HIGH
Potassium: 4
Sodium: 135
Total Bilirubin: 0.3
Total Protein: 6.9

## 2011-09-19 LAB — POCT PREGNANCY, URINE
Operator id: 29727
Preg Test, Ur: NEGATIVE

## 2011-09-19 LAB — DIFFERENTIAL
Basophils Relative: 4 — ABNORMAL HIGH
Lymphs Abs: 1.8
Monocytes Relative: 5
Neutro Abs: 4.9
Neutrophils Relative %: 66

## 2011-09-19 LAB — LIPASE, BLOOD: Lipase: 25

## 2011-09-20 LAB — POCT PREGNANCY, URINE
Operator id: 19830
Preg Test, Ur: NEGATIVE

## 2011-09-24 LAB — COMPREHENSIVE METABOLIC PANEL
Albumin: 2.9 — ABNORMAL LOW
Alkaline Phosphatase: 93
BUN: 5 — ABNORMAL LOW
Calcium: 9
Creatinine, Ser: 0.97
Glucose, Bld: 110 — ABNORMAL HIGH
Potassium: 3.9
Total Protein: 6.9

## 2011-09-24 LAB — URINALYSIS, ROUTINE W REFLEX MICROSCOPIC
Bilirubin Urine: NEGATIVE
Nitrite: NEGATIVE
Protein, ur: NEGATIVE
Specific Gravity, Urine: 1.025
Urobilinogen, UA: 0.2

## 2011-09-24 LAB — DIFFERENTIAL
Basophils Relative: 0
Lymphocytes Relative: 32
Lymphs Abs: 1.9
Monocytes Absolute: 0.4
Monocytes Relative: 6
Neutro Abs: 3.5
Neutrophils Relative %: 60

## 2011-09-24 LAB — POCT PREGNANCY, URINE: Operator id: 244461

## 2011-09-24 LAB — CBC
HCT: 32.8 — ABNORMAL LOW
Hemoglobin: 11.1 — ABNORMAL LOW
MCHC: 34
Platelets: 220
RDW: 13.4

## 2011-11-05 ENCOUNTER — Encounter (HOSPITAL_COMMUNITY): Payer: Self-pay

## 2011-11-05 ENCOUNTER — Emergency Department (INDEPENDENT_AMBULATORY_CARE_PROVIDER_SITE_OTHER)
Admission: EM | Admit: 2011-11-05 | Discharge: 2011-11-05 | Disposition: A | Discharge status: Home / Self Care | Payer: BC Managed Care – PPO | Source: Home / Self Care

## 2011-11-05 DIAGNOSIS — N760 Acute vaginitis: Secondary | ICD-10-CM

## 2011-11-05 LAB — WET PREP, GENITAL
Trich, Wet Prep: NONE SEEN
Yeast Wet Prep HPF POC: NONE SEEN

## 2011-11-05 MED ORDER — METRONIDAZOLE 500 MG PO TABS: 500.0000 mg | ORAL_TABLET | Freq: Two times a day (BID) | ORAL | Status: AC

## 2011-11-05 NOTE — ED Provider Notes (Signed)
History     CSN: [id number] Arrival date & time: 11/05/2011  8:13 PM   First MD Initiated Contact with Patient 11/05/11 1957      Chief Complaint  Patient presents with  . Vaginal Discharge    4 day hx of vaginal discharge.  has foul odor.  vaginal itching.      (Consider location/radiation/quality/duration/timing/severity/associated sxs/prior treatment) Patient is a 35 y.o. female presenting with vaginal discharge. The history is provided by the patient.  Vaginal Discharge This is a new problem. The current episode started more than 2 days ago (bad oddorus vaginal discharge. Had unprotected sex about 1 week ago. ). The problem occurs constantly. The problem has not changed since onset.Pertinent negatives include no abdominal pain and no headaches. Associated symptoms comments: Denies pelvic pain fever or chills..    Past Medical History  Diagnosis Date  . HSV-2 infection   . Vaginal delivery 2007    Past Surgical History  Procedure Date  . Knee arthroscopy w/ acl reconstruction 2001  . Gallbladder surgery 2008    Family History  Problem Relation Age of Onset  . Hypertension Mother   . Cancer Maternal Grandmother     LUNG CANCER  . Diabetes Paternal Grandmother     History  Substance Use Topics  . Smoking status: Never Smoker   . Smokeless tobacco: Never Used  . Alcohol Use: No    OB History    Grav Para Term Preterm Abortions TAB SAB Ect Mult Living                  Review of Systems  Constitutional: Positive for activity change.  HENT: Negative.   Gastrointestinal: Negative for abdominal pain.  Genitourinary: Positive for vaginal discharge. Negative for dysuria, frequency, flank pain, genital sores, vaginal pain, pelvic pain and dyspareunia.  Skin: Negative for rash.  Neurological: Negative for headaches.    Allergies  Review of patient's allergies indicates no known allergies.  Home Medications   Current Outpatient Rx  Name Route Sig Dispense  Refill  . FIBER SELECT GUMMIES PO CHEW Oral Chew by mouth.      Marland Kitchen FLUCONAZOLE 150 MG PO TABS  TAKE 1 TABLET BY MOUTH NOW 1 tablet 1  . LEVONORGESTREL 20 MCG/24HR IU IUD Intrauterine 1 each by Intrauterine route once. INSERTED 07/12/10     . METRONIDAZOLE 500 MG PO TABS Oral Take 1 tablet (500 mg total) by mouth 2 (two) times daily. 14 tablet 0  . VALTREX PO Oral Take by mouth.      Marland Kitchen VALTREX 500 MG PO TABS  TAKE AS DIRECTED 30 tablet 1    Dispense as written.    LMP 10/29/2011  Physical Exam  Nursing note and vitals reviewed. Constitutional: She appears well-developed and well-nourished. No distress.  HENT:  Mouth/Throat: Oropharynx is clear and moist. No oropharyngeal exudate.  Eyes: Conjunctivae are normal. Pupils are equal, round, and reactive to light.  Cardiovascular: Normal heart sounds.   Pulmonary/Chest: Breath sounds normal.  Abdominal: Soft. There is no tenderness.  Genitourinary: Uterus normal. Cervix exhibits no motion tenderness. Right adnexum displays no mass and no tenderness. Left adnexum displays no mass and no tenderness. Vaginal discharge found.    Lymphadenopathy:    She has no cervical adenopathy.       Right: No inguinal adenopathy present.       Left: No inguinal adenopathy present.    ED Course  Procedures (including critical care time)  Labs Reviewed - No  data to display No results found.   1. Vaginitis       MDM   Pt. Treated with flagil. GC/ChL samples taken.       Sharin Grave, MD 11/08/11 1135

## 2011-11-05 NOTE — ED Notes (Signed)
Patient given discharge instructions by Dr Tressia Danas, expressed understanding and had all questions answered.  Pt given RX x1

## 2011-11-05 NOTE — ED Notes (Signed)
4 day hx of vaginal discharge.  has foul odor.  vaginal itching.

## 2011-11-06 LAB — GC/CHLAMYDIA PROBE AMP, GENITAL
Chlamydia, DNA Probe: NEGATIVE
GC Probe Amp, Genital: NEGATIVE

## 2011-11-15 ENCOUNTER — Encounter: Payer: Self-pay | Admitting: Gynecology

## 2011-11-15 ENCOUNTER — Ambulatory Visit (INDEPENDENT_AMBULATORY_CARE_PROVIDER_SITE_OTHER): Payer: BC Managed Care – PPO | Admitting: Gynecology

## 2011-11-15 DIAGNOSIS — B373 Candidiasis of vulva and vagina: Secondary | ICD-10-CM

## 2011-11-15 DIAGNOSIS — L293 Anogenital pruritus, unspecified: Secondary | ICD-10-CM

## 2011-11-15 DIAGNOSIS — B3731 Acute candidiasis of vulva and vagina: Secondary | ICD-10-CM

## 2011-11-15 DIAGNOSIS — N898 Other specified noninflammatory disorders of vagina: Secondary | ICD-10-CM

## 2011-11-15 MED ORDER — FLUCONAZOLE 200 MG PO TABS: 200.0000 mg | ORAL_TABLET | Freq: Every day | ORAL | Status: AC

## 2011-11-15 NOTE — Progress Notes (Signed)
Patient presents with a one-day history of vaginal itching and discharge.  Exam with chaperone present External BUS vagina with white discharge KOH wet prep done, cervix normal IUD string visualized, uterus normal size midline mobile nontender, adnexa without masses or tenderness  Assessment and plan: Wet prep is positive for yeast we'll treat with Diflucan 200 daily for 3 days due to some recurrences to see if we cannot eradicate colonization. She is due for her annual this coming May and I reminded her to schedule this.

## 2011-11-15 NOTE — Patient Instructions (Signed)
Take yeast medication as prescribed. Follow up for your annual exam when due in May 2013

## 2011-12-06 ENCOUNTER — Other Ambulatory Visit: Payer: Self-pay | Admitting: Gynecology

## 2011-12-06 MED ORDER — TERCONAZOLE 0.8 % VA CREA: 1.0000 | TOPICAL_CREAM | Freq: Every day | VAGINAL | Status: AC

## 2011-12-06 NOTE — Telephone Encounter (Signed)
Tell patient that if she is having recurrent yeast symptoms we just treated her with Diflucan x3 days and I think we need to go to a stronger cream and recommend Terazol 3 day cream.

## 2011-12-18 ENCOUNTER — Other Ambulatory Visit: Payer: Self-pay | Admitting: Gynecology

## 2011-12-20 ENCOUNTER — Encounter: Payer: Self-pay | Admitting: Gynecology

## 2011-12-20 ENCOUNTER — Ambulatory Visit (INDEPENDENT_AMBULATORY_CARE_PROVIDER_SITE_OTHER): Payer: BC Managed Care – PPO | Admitting: Gynecology

## 2011-12-20 DIAGNOSIS — B009 Herpesviral infection, unspecified: Secondary | ICD-10-CM

## 2011-12-20 DIAGNOSIS — N898 Other specified noninflammatory disorders of vagina: Secondary | ICD-10-CM

## 2011-12-20 DIAGNOSIS — N760 Acute vaginitis: Secondary | ICD-10-CM

## 2011-12-20 DIAGNOSIS — N949 Unspecified condition associated with female genital organs and menstrual cycle: Secondary | ICD-10-CM

## 2011-12-20 DIAGNOSIS — A499 Bacterial infection, unspecified: Secondary | ICD-10-CM

## 2011-12-20 LAB — WET PREP FOR TRICH, YEAST, CLUE

## 2011-12-20 MED ORDER — METRONIDAZOLE 500 MG PO TABS: 500.0000 mg | ORAL_TABLET | Freq: Two times a day (BID) | ORAL | Status: AC

## 2011-12-20 MED ORDER — ACYCLOVIR 400 MG PO TABS: 400.0000 mg | ORAL_TABLET | Freq: Two times a day (BID) | ORAL | Status: AC

## 2011-12-20 NOTE — Progress Notes (Signed)
Patient presents with vaginal discharge with odor. She recently was treated by another physician with [employer] but did not seem to help. Also has was something different for HSV suppression. She is on Valtrex but it cost her over $100 for last month.  Exam with Sherrilyn Rist chaperone present Pelvic: External BUS vagina with heavy white discharge. Cervix grossly normal. Uterus midline mobile nontender. Adnexa without masses or tenderness  Assessment and plan: 1. Vaginal discharge with odor. KOH wet prep is suspicious for BV. We'll treat with Flagyl 500 twice a day x7 days alcohol voidance reviewed. Follow up if symptoms persist or recur. 2. HSV treatment. Valtrex as her expensive. We'll try acyclovir 400 mg generic one by mouth twice a day at the earliest onset of outbreak for 3-5 days. #32 refills given. Daily suppressive options were reviewed but she has only occasional outbreaks and would prefer to use intermittent treatment.

## 2011-12-20 NOTE — Patient Instructions (Signed)
Take Flagyl 500 mg twice daily for one week avoid alcohol. Try acyclovir 400 mg twice daily at early onset of herpes outbreak for several days.

## 2011-12-21 ENCOUNTER — Ambulatory Visit: Payer: BC Managed Care – PPO | Admitting: Gynecology

## 2012-02-13 ENCOUNTER — Telehealth: Payer: Self-pay | Admitting: *Deleted

## 2012-02-13 MED ORDER — FLUCONAZOLE 150 MG PO TABS: 150.0000 mg | ORAL_TABLET | Freq: Once | ORAL | Status: AC

## 2012-02-13 NOTE — Telephone Encounter (Signed)
Pt informed with the below note. 

## 2012-02-13 NOTE — Telephone Encounter (Signed)
Pt calling c/o yeast infection x 1 day itching and burning no discharge. Pt was seen in jan 2013 and treated for BV with flagyl. Pt said can't make OV because her daughter has pink eye with ear infection. Pt would like rx. Please advise

## 2012-02-13 NOTE — Telephone Encounter (Signed)
Diflucan 150 mg x1 dose follow up if symptoms persist

## 2012-02-22 ENCOUNTER — Ambulatory Visit: Payer: BC Managed Care – PPO | Admitting: Internal Medicine

## 2012-02-22 VITALS — BP 129/82 | HR 99 | Temp 98.9°F | Resp 16 | Ht 70.0 in | Wt 277.0 lb

## 2012-02-22 DIAGNOSIS — J019 Acute sinusitis, unspecified: Secondary | ICD-10-CM

## 2012-02-22 MED ORDER — AMOXICILLIN 500 MG PO CAPS: 1000.0000 mg | ORAL_CAPSULE | Freq: Two times a day (BID) | ORAL | Status: AC

## 2012-02-22 MED ORDER — MOMETASONE FUROATE 50 MCG/ACT NA SUSP: 2.0000 | Freq: Every day | NASAL | Status: DC

## 2012-02-22 MED ORDER — FLUCONAZOLE 150 MG PO TABS: ORAL_TABLET | ORAL | Status: AC

## 2012-02-22 NOTE — Progress Notes (Signed)
  Subjective:    Patient ID: Tracy Bowen, female    DOB: 1976-01-12, 36 y.o.   MRN: [id number]  Eye Problem  There is pain in the left eye. This is a new problem. The current episode started in the past 7 days. The problem occurs constantly. The problem has been gradually worsening. There was no injury mechanism. The patient is experiencing no pain. Associated symptoms include a recent URI. Pertinent negatives include no fever or foreign body sensation. She has tried nothing for the symptoms.  URI  This is a new problem. The current episode started 1 to 4 weeks ago. The problem has been unchanged. Associated symptoms include congestion, coughing, headaches, rhinorrhea and sinus pain. Pertinent negatives include no chest pain. The treatment provided no relief.  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  Her left eye this morning was pink and had more drainage crusted than usual.  She has no photophobia or vision changes.  She denies fever or productive cough.  She tells me she has 1-2 sinus infections a year, has never seen an ENT and has not had PE tubes or any ENT procedures. She is currently using her Biagio Quint and is not pregnant. She does have seasonal allergies.   Review of Systems  Constitutional: Negative for fever.  HENT: Positive for congestion and rhinorrhea.   Respiratory: Positive for cough.   Cardiovascular: Negative for chest pain.  Neurological: Positive for headaches.  All other systems reviewed and are negative.       Objective:   Physical Exam  Vitals reviewed. Constitutional: She is oriented to person, place, and time. She appears well-developed and well-nourished.       obese  HENT:  Head: Normocephalic.  Right Ear: External ear normal.  Left Ear: External ear normal.       TM's clear bilaterally.  Left nasal mucosa swollen with thick white drainage.  Maxillary sinus TTP   Eyes: Conjunctivae are normal.  Neck: Neck supple.  Cardiovascular: Normal rate, regular rhythm and normal heart sounds.   Pulmonary/Chest: Effort normal and breath sounds normal. No respiratory distress. She has no wheezes. She has no rales.  Lymphadenopathy:    She has no cervical adenopathy.  Neurological: She is alert and oriented to person, place, and time.  Skin: Skin is warm and dry.  Psychiatric: She has a normal mood and affect. Her behavior is normal.          Assessment & Plan:  Sinusitis:  Amoxicillin 1000 mg BID and Nasonex prescribed.  AVS printed and given to pt.  Extra rest and fluids encouraged.  Diflucan given as pt gets vaginitis following antibiotics.  RTC if not better in 3-4 days.

## 2012-02-22 NOTE — Patient Instructions (Signed)
Take your Amoxicillin twice daily for sinusitis and use the Nasonex as needed for sinus pain.  Get extra rest and plenty of fluids.  RTC if not improved in 3-4 days. Sinusitis Sinuses are air pockets within the bones of your face. The growth of bacteria within a sinus leads to infection. The infection prevents the sinuses from draining. This infection is called sinusitis. SYMPTOMS  There will be different areas of pain depending on which sinuses have become infected.  The maxillary sinuses often produce pain beneath the eyes.   Frontal sinusitis may cause pain in the middle of the forehead and above the eyes.  Other problems (symptoms) include:  Toothaches.   Colored, pus-like (purulent) drainage from the nose.   Swelling, warmth, and tenderness over the sinus areas may be signs of infection.  TREATMENT  Sinusitis is most often determined by an exam.X-rays may be taken. If x-rays have been taken, make sure you obtain your results or find out how you are to obtain them. Your caregiver may give you medications (antibiotics). These are medications that will help kill the bacteria causing the infection. You may also be given a medication (decongestant) that helps to reduce sinus swelling.  HOME CARE INSTRUCTIONS   Only take over-the-counter or prescription medicines for pain, discomfort, or fever as directed by your caregiver.   Drink extra fluids. Fluids help thin the mucus so your sinuses can drain more easily.   Applying either moist heat or ice packs to the sinus areas may help relieve discomfort.   Use saline nasal sprays to help moisten your sinuses. The sprays can be found at your local drugstore.  SEEK IMMEDIATE MEDICAL CARE IF:  You have a fever.   You have increasing pain, severe headaches, or toothache.   You have nausea, vomiting, or drowsiness.   You develop unusual swelling around the face or trouble seeing.  MAKE SURE YOU:   Understand these instructions.   Will  watch your condition.   Will get help right away if you are not doing well or get worse.  Document Released: 11/26/2005 Document Revised: 11/15/2011 Document Reviewed: 06/25/2007 [facility] Patient Information 2012 [place], [place].

## 2012-03-24 ENCOUNTER — Encounter: Payer: Self-pay | Admitting: Gynecology

## 2012-03-24 ENCOUNTER — Encounter: Payer: Self-pay | Admitting: *Deleted

## 2012-03-24 ENCOUNTER — Ambulatory Visit (INDEPENDENT_AMBULATORY_CARE_PROVIDER_SITE_OTHER): Payer: BC Managed Care – PPO | Admitting: Gynecology

## 2012-03-24 DIAGNOSIS — N760 Acute vaginitis: Secondary | ICD-10-CM

## 2012-03-24 DIAGNOSIS — N898 Other specified noninflammatory disorders of vagina: Secondary | ICD-10-CM

## 2012-03-24 DIAGNOSIS — A499 Bacterial infection, unspecified: Secondary | ICD-10-CM

## 2012-03-24 LAB — WET PREP FOR TRICH, YEAST, CLUE: Clue Cells Wet Prep HPF POC: NONE SEEN

## 2012-03-24 MED ORDER — METRONIDAZOLE 500 MG PO TABS: 500.0000 mg | ORAL_TABLET | Freq: Two times a day (BID) | ORAL | Status: AC

## 2012-03-24 MED ORDER — FLUCONAZOLE 150 MG PO TABS: 150.0000 mg | ORAL_TABLET | Freq: Once | ORAL | Status: AC

## 2012-03-24 NOTE — Progress Notes (Signed)
The patient presents complaining of vaginal discharge and itching. She was recently treated for a sinusitis with antibiotic.  Exam share chaperone present Pelvic external BUS vagina normal with white frothy discharge. Cervix normal IUD string visualized. Uterus normal size midline mobile nontender. Adnexa without masses or tenderness.  Assessment and plan: Wet prep is positive for BV. We'll treat with Flagyl 500 twice a day x7 days, alcohol avoidance reviewed. I did add Diflucan 150x1 dose given the itching and recent antibiotic treatment just to cover her for subclinical yeast although it did not show up on the wet prep. Patient is due for her annual exam in May I reminded her to schedule this.

## 2012-03-24 NOTE — Patient Instructions (Signed)
Follow up if discharge or itching persist. Otherwise make appointment for your annual exam in one month.

## 2012-04-30 ENCOUNTER — Telehealth: Payer: Self-pay | Admitting: *Deleted

## 2012-04-30 NOTE — Telephone Encounter (Signed)
Left the below note on pt voicemail. 

## 2012-04-30 NOTE — Telephone Encounter (Signed)
Patient needs office visit for further evaluation

## 2012-04-30 NOTE — Telephone Encounter (Signed)
(  Pt of Dr. Audie Box) pt is calling c/o yeast infection x 3 days now, tried OTC monistat but no relief, itching,white discharge, vaginal irritation. Last ov 03/24/12. Please advise

## 2012-05-01 ENCOUNTER — Ambulatory Visit (INDEPENDENT_AMBULATORY_CARE_PROVIDER_SITE_OTHER): Payer: BC Managed Care – PPO | Admitting: Women's Health

## 2012-05-01 ENCOUNTER — Encounter: Payer: Self-pay | Admitting: Women's Health

## 2012-05-01 DIAGNOSIS — L293 Anogenital pruritus, unspecified: Secondary | ICD-10-CM

## 2012-05-01 DIAGNOSIS — R300 Dysuria: Secondary | ICD-10-CM

## 2012-05-01 DIAGNOSIS — N898 Other specified noninflammatory disorders of vagina: Secondary | ICD-10-CM

## 2012-05-01 LAB — URINALYSIS W MICROSCOPIC + REFLEX CULTURE
Bilirubin Urine: NEGATIVE
Glucose, UA: NEGATIVE mg/dL
Ketones, ur: NEGATIVE mg/dL
Nitrite: NEGATIVE
Specific Gravity, Urine: >1.03 — ABNORMAL HIGH (ref 1.005–1.030)
pH: 5 (ref 5.0–8.0)

## 2012-05-01 LAB — WET PREP FOR TRICH, YEAST, CLUE
Trich, Wet Prep: NONE SEEN
Yeast Wet Prep HPF POC: NONE SEEN

## 2012-05-01 MED ORDER — FLUCONAZOLE 150 MG PO TABS: 150.0000 mg | ORAL_TABLET | Freq: Once | ORAL | Status: AC

## 2012-05-01 NOTE — Patient Instructions (Signed)
Monilial Vaginitis Vaginitis in a soreness, swelling and redness (inflammation) of the vagina and vulva. Monilial vaginitis is not a sexually transmitted infection. CAUSES  Yeast vaginitis is caused by yeast (candida) that is normally found in your vagina. With a yeast infection, the candida has overgrown in number to a point that upsets the chemical balance. SYMPTOMS   White, thick vaginal discharge.   Swelling, itching, redness and irritation of the vagina and possibly the lips of the vagina (vulva).   Burning or painful urination.   Painful intercourse.  DIAGNOSIS  Things that may contribute to monilial vaginitis are:  Postmenopausal and virginal states.   Pregnancy.   Infections.   Being tired, sick or stressed, especially if you had monilial vaginitis in the past.   Diabetes. Good control will help lower the chance.   Birth control pills.   Tight fitting garments.   Using bubble bath, feminine sprays, douches or deodorant tampons.   Taking certain medications that kill germs (antibiotics).   Sporadic recurrence can occur if you become ill.  TREATMENT  Your caregiver will give you medication.  There are several kinds of anti monilial vaginal creams and suppositories specific for monilial vaginitis. For recurrent yeast infections, use a suppository or cream in the vagina 2 times a week, or as directed.   Anti-monilial or steroid cream for the itching or irritation of the vulva may also be used. Get your caregiver's permission.   Painting the vagina with methylene blue solution may help if the monilial cream does not work.   Eating yogurt may help prevent monilial vaginitis.  HOME CARE INSTRUCTIONS   Finish all medication as prescribed.   Do not have sex until treatment is completed or after your caregiver tells you it is okay.   Take warm sitz baths.   Do not douche.   Do not use tampons, especially scented ones.   Wear cotton underwear.   Avoid tight  pants and panty hose.   Tell your sexual partner that you have a yeast infection. They should go to their caregiver if they have symptoms such as mild rash or itching.   Your sexual partner should be treated as well if your infection is difficult to eliminate.   Practice safer sex. Use condoms.   Some vaginal medications cause latex condoms to fail. Vaginal medications that harm condoms are:   Cleocin cream.   Butoconazole (Femstat).   Terconazole (Terazol) vaginal suppository.   Miconazole (Monistat) (may be purchased over the counter).  SEEK MEDICAL CARE IF:   You have a temperature by mouth above 102 F (38.9 C).   The infection is getting worse after 2 days of treatment.   The infection is not getting better after 3 days of treatment.   You develop blisters in or around your vagina.   You develop vaginal bleeding, and it is not your menstrual period.   You have pain when you urinate.   You develop intestinal problems.   You have pain with sexual intercourse.  Document Released: 09/05/2005 Document Revised: 11/15/2011 Document Reviewed: 05/20/2009 [employer] Patient Information 2012 [employer]. 

## 2012-05-01 NOTE — Progress Notes (Signed)
Patient ID: Tracy Bowen, female   DOB: March 04, 1976, 36 y.o.   MRN: [id number] Presents with the complaint of vaginal itching for several days and burning with urination. Denies any pain at the end of the stream of urination. Denies a fever. Amenorrheic on [name]/ August of 2011.  Exam: No CVAT, abdomen soft, no rebound or radiation of pain. External genitalia within normal limits, speculum exam cervix - IUD strings visible, moderate amount of white curdy discharge without an odor. Wet prep negative. UA positive for trace leukocytes many bacteria in 3-6 WBCs.  Symptomatic yeast  Plan: Diflucan 150 by mouth x1 dose. Urine culture pending. Instructed to call if symptoms persist.

## 2012-05-03 LAB — URINE CULTURE: Colony Count: NO GROWTH

## 2012-05-20 ENCOUNTER — Ambulatory Visit (INDEPENDENT_AMBULATORY_CARE_PROVIDER_SITE_OTHER): Payer: BC Managed Care – PPO | Admitting: Gynecology

## 2012-05-20 ENCOUNTER — Encounter: Payer: BC Managed Care – PPO | Admitting: Gynecology

## 2012-05-20 ENCOUNTER — Encounter: Payer: Self-pay | Admitting: Gynecology

## 2012-05-20 VITALS — BP 120/74 | Ht 70.5 in | Wt 274.0 lb

## 2012-05-20 DIAGNOSIS — Z30431 Encounter for routine checking of intrauterine contraceptive device: Secondary | ICD-10-CM

## 2012-05-20 DIAGNOSIS — Z01419 Encounter for gynecological examination (general) (routine) without abnormal findings: Secondary | ICD-10-CM

## 2012-05-20 DIAGNOSIS — B009 Herpesviral infection, unspecified: Secondary | ICD-10-CM

## 2012-05-20 MED ORDER — VALTREX 500 MG PO TABS: 500.0000 mg | ORAL_TABLET | Freq: Two times a day (BID) | ORAL | Status: DC

## 2012-05-20 NOTE — Patient Instructions (Signed)
Follow up in one year for annual gynecologic exam. 

## 2012-05-20 NOTE — Progress Notes (Signed)
Tracy Bowen 08-01-1976 [id number]        36 y.o.  for annual exam.  Doing well  Past medical history,surgical history, medications, allergies, family history and social history were all reviewed and documented in the EPIC chart. ROS:  Was performed and pertinent positives and negatives are included in the history.  Exam: . Sherrilyn Rist chaperone present Filed Vitals:   05/20/12 1008  BP: 120/74   General appearance  Normal Skin grossly normal Head/Neck normal with no cervical or supraclavicular adenopathy thyroid normal Lungs  clear Cardiac RR, without RMG Abdominal  soft, nontender, without masses, organomegaly or hernia Breasts  examined lying and sitting without masses, retractions, discharge or axillary adenopathy. Pelvic  Ext/BUS/vagina  normal   Cervix  normal with IUD string visualized  Uterus  anteverted, normal size, shape and contour, midline and mobile nontender   Adnexa  Without masses or tenderness    Anus and perineum  normal   Rectovaginal  normal sphincter tone without palpated masses or tenderness.    Assessment/Plan:  36 y.o. female for annual exam.    1. [name] placed August 2011. Patient is doing well with scant to absent menses. IUD string visualized. 2. Pap smear. No Pap smear was done today. She has no history of abnormal Pap smears with last normal Pap smear 2012. Discussed less frequent screening per current screening guidelines we'll plan every 3-5 your screen. 3. Breast self. SBE monthly reviewed. 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 Daily suppressive treatment options reviewed and declined. I refilled her Valtrex 500 mg 1 twice a day x5 days with outbreak #30 with one refill. 5. Health maintenance. No blood work was done today as she had a normal glucose lipid profile CBC last year. Patient will see me in a year sooner if she continues well, sooner as needed.    Dara Lords MD, 10:35 AM 05/20/2012

## 2012-05-21 LAB — URINALYSIS W MICROSCOPIC + REFLEX CULTURE
Bacteria, UA: NONE SEEN
Bilirubin Urine: NEGATIVE
Crystals: NONE SEEN
Glucose, UA: NEGATIVE mg/dL
Ketones, ur: NEGATIVE mg/dL
Protein, ur: NEGATIVE mg/dL
Urobilinogen, UA: 0.2 mg/dL (ref 0.0–1.0)

## 2012-06-18 ENCOUNTER — Encounter: Payer: Self-pay | Admitting: Gynecology

## 2012-06-18 ENCOUNTER — Ambulatory Visit (INDEPENDENT_AMBULATORY_CARE_PROVIDER_SITE_OTHER): Payer: BC Managed Care – PPO | Admitting: Gynecology

## 2012-06-18 DIAGNOSIS — L739 Follicular disorder, unspecified: Secondary | ICD-10-CM

## 2012-06-18 DIAGNOSIS — A499 Bacterial infection, unspecified: Secondary | ICD-10-CM

## 2012-06-18 DIAGNOSIS — N898 Other specified noninflammatory disorders of vagina: Secondary | ICD-10-CM

## 2012-06-18 DIAGNOSIS — N899 Noninflammatory disorder of vagina, unspecified: Secondary | ICD-10-CM

## 2012-06-18 DIAGNOSIS — B9689 Other specified bacterial agents as the cause of diseases classified elsewhere: Secondary | ICD-10-CM

## 2012-06-18 DIAGNOSIS — L738 Other specified follicular disorders: Secondary | ICD-10-CM

## 2012-06-18 DIAGNOSIS — N760 Acute vaginitis: Secondary | ICD-10-CM

## 2012-06-18 LAB — WET PREP FOR TRICH, YEAST, CLUE
Clue Cells Wet Prep HPF POC: NONE SEEN
Trich, Wet Prep: NONE SEEN

## 2012-06-18 MED ORDER — METRONIDAZOLE 500 MG PO TABS: 500.0000 mg | ORAL_TABLET | Freq: Two times a day (BID) | ORAL | Status: AC

## 2012-06-18 MED ORDER — BETAMETHASONE DIPROPIONATE AUG 0.05 % EX CREA: TOPICAL_CREAM | Freq: Two times a day (BID) | CUTANEOUS | Status: DC

## 2012-06-18 NOTE — Progress Notes (Signed)
Patient presents with two-day history of vaginal irritation. She used an OTC Monistat ovule with persistence of her symptoms. No real significant discharge or odor. Also notes some irritation in the mons area after waxing. She was concerned that this would be an HSV outbreak.  Exam was Sherrilyn Rist assistant Pelvic: External with folliculitis in multiple areas superior mons. No evidence of ulceration or HSV. BUS vagina with white discharge. Bimanual without masses or tenderness.  Assessment and plan: 1. Wet preps positive for BV. We'll treat with Flagyl 500 twice a day x7 days, alcohol avoidance reviewed. Follow up if symptoms persist or recur. 2. Folliculitis due to waxing. Does not appear to be infectious.  Diprolene 0.05% cream twice daily. Follow up if symptoms persist or recur.

## 2012-06-18 NOTE — Patient Instructions (Signed)
Apply steroid cream to the hairline twice daily. Follow up if symptoms persist or worsen. Take Flagyl medication twice daily for one week. Follow up if vaginal irritation continues. Avoid alcohol taking this medication.

## 2012-06-26 ENCOUNTER — Telehealth: Payer: Self-pay | Admitting: *Deleted

## 2012-06-26 ENCOUNTER — Other Ambulatory Visit: Payer: BC Managed Care – PPO

## 2012-06-26 DIAGNOSIS — N390 Urinary tract infection, site not specified: Secondary | ICD-10-CM

## 2012-06-26 NOTE — Telephone Encounter (Signed)
Pt c/o UTI s/s x 2 days  frequent urination only, no burning, pt was seen on 06/18/12 given medication for BV. Okay for pt to leave U/A? Please advise

## 2012-06-26 NOTE — Telephone Encounter (Signed)
Pt informed with the below note, order placed. 

## 2012-06-26 NOTE — Telephone Encounter (Signed)
Okay to leave specimen

## 2012-06-27 ENCOUNTER — Telehealth: Payer: Self-pay | Admitting: Gynecology

## 2012-06-27 LAB — URINALYSIS W MICROSCOPIC + REFLEX CULTURE
Bilirubin Urine: NEGATIVE
Crystals: NONE SEEN
Glucose, UA: NEGATIVE mg/dL
Ketones, ur: NEGATIVE mg/dL
Specific Gravity, Urine: 1.022 (ref 1.005–1.030)
Squamous Epithelial / LPF: NONE SEEN
Urobilinogen, UA: 0.2 mg/dL (ref 0.0–1.0)
WBC, UA: >50 WBC/hpf — AB (ref <3)

## 2012-06-27 MED ORDER — NITROFURANTOIN MONOHYD MACRO 100 MG PO CAPS: 100.0000 mg | ORAL_CAPSULE | Freq: Two times a day (BID) | ORAL | Status: AC

## 2012-06-27 NOTE — Telephone Encounter (Signed)
Pt informed the below 

## 2012-06-27 NOTE — Telephone Encounter (Signed)
Tell patient that her urine does look like a UTI. We'll treat with Macrobid 100 mg twice a day x7 days

## 2012-07-01 LAB — URINE CULTURE

## 2012-08-25 ENCOUNTER — Ambulatory Visit: Payer: BC Managed Care – PPO

## 2012-08-25 ENCOUNTER — Ambulatory Visit: Payer: BC Managed Care – PPO | Admitting: Family Medicine

## 2012-08-25 VITALS — BP 124/86 | HR 110 | Temp 98.7°F | Resp 16 | Ht 71.0 in | Wt 275.0 lb

## 2012-08-25 DIAGNOSIS — R52 Pain, unspecified: Secondary | ICD-10-CM

## 2012-08-25 DIAGNOSIS — S93409A Sprain of unspecified ligament of unspecified ankle, initial encounter: Secondary | ICD-10-CM

## 2012-08-25 NOTE — Progress Notes (Signed)
  Subjective:    Patient ID: Tracy Bowen, female    DOB: 02-23-1976, 36 y.o.   MRN: [id number]  HPI  Sat night (36 hrs prev) pt fell of the side of a curb and suffered an inversion injury to her Right ankle. Pt prev played baseketball so has a h/o many ankle sprains but no breaks or h/o complete tears.  Has taken some ibuprofen and been icing, resting, and elevating ankle. Gets adequate pain relief w/ ibuprofen.  Was able to walk/limp away after tripping and walk into clinic today.    Review of Systems  Cardiovascular: Positive for leg swelling.  Musculoskeletal: Positive for myalgias, joint swelling, arthralgias and gait problem. Negative for back pain.  Skin: Negative for color change, pallor, rash and wound.  Neurological: Negative for syncope, weakness and numbness.  Hematological: Does not bruise/bleed easily.       Objective:   Physical Exam  Vitals reviewed. Constitutional: She is oriented to person, place, and time. She appears well-developed and well-nourished. No distress.  HENT:  Head: Normocephalic and atraumatic.  Right Ear: External ear normal.  Left Ear: External ear normal.  Pulmonary/Chest: Effort normal.  Musculoskeletal: She exhibits tenderness.       Right ankle: She exhibits decreased range of motion and swelling. She exhibits no ecchymosis, no laceration and normal pulse. tenderness. Lateral malleolus, AITFL, CF ligament, posterior TFL and proximal fibula tenderness found. No medial malleolus and no head of 5th metatarsal tenderness found. Achilles tendon normal.       + squeeze test, negative anterior drawer and talar tilt  Neurological: She is alert and oriented to person, place, and time.  Skin: Skin is warm and dry. No rash noted. She is not diaphoretic. No erythema. No pallor.  Psychiatric: She has a normal mood and affect. Her behavior is normal.        3 view Rt ankle x-ray: No bony abnormality seen. Soft tissue swelling visible. Assessment &  Plan:  1. Grade II ankle sprain - Had aircast applied. Pt felt pain would be controlled with ibuprofen 800mg  tid prn and agrees to proceed w/ RICE.  Wear aircast x 3 wks and RTC if not resolved w/in that time or if worsens.

## 2012-08-26 ENCOUNTER — Other Ambulatory Visit: Payer: Self-pay | Admitting: Gynecology

## 2012-08-26 NOTE — Progress Notes (Signed)
Reviewed and agree.

## 2012-09-09 DIAGNOSIS — D219 Benign neoplasm of connective and other soft tissue, unspecified: Secondary | ICD-10-CM

## 2012-09-09 HISTORY — DX: Benign neoplasm of connective and other soft tissue, unspecified: D21.9

## 2012-09-16 ENCOUNTER — Encounter: Payer: Self-pay | Admitting: Gynecology

## 2012-09-16 ENCOUNTER — Ambulatory Visit (INDEPENDENT_AMBULATORY_CARE_PROVIDER_SITE_OTHER): Payer: BC Managed Care – PPO | Admitting: Gynecology

## 2012-09-16 DIAGNOSIS — N760 Acute vaginitis: Secondary | ICD-10-CM

## 2012-09-16 DIAGNOSIS — B9689 Other specified bacterial agents as the cause of diseases classified elsewhere: Secondary | ICD-10-CM

## 2012-09-16 DIAGNOSIS — A499 Bacterial infection, unspecified: Secondary | ICD-10-CM

## 2012-09-16 DIAGNOSIS — N898 Other specified noninflammatory disorders of vagina: Secondary | ICD-10-CM

## 2012-09-16 LAB — WET PREP FOR TRICH, YEAST, CLUE: Yeast Wet Prep HPF POC: NONE SEEN

## 2012-09-16 MED ORDER — TINIDAZOLE 500 MG PO TABS: 2.0000 g | ORAL_TABLET | Freq: Once | ORAL | Status: DC

## 2012-09-16 NOTE — Progress Notes (Signed)
Patient presents with several day history of vaginal discharge and itching. She used OTC antifungal without relief.  Exam with [facility] assistant Pelvic external BUS vagina with frothy white discharge. Cervix normal, IUD string visualized. Uterus normal size midline mobile nontender. Adnexa without masses or tenderness.  Assessment and plan: 1. Vaginal discharge. Wet prep consistent with BV. We'll treat with Tindamax 2 g daily x2 doses. Alcohol avoidance reviewed. Follow up if symptoms persist or recur. 2. Contraceptive management. Patient wants to proceed with tubal sterilization. I again reviewed the options to include LARC such as IUD continuation and Implanon. I reviewed in general was involved with tubal sterilization to include general anesthesia, laparoscopic trocar placement, Falope ring/cautery back up and the risks of internal organ damage such as bowel bladder ureters vessels and nerves. Patient wants to go ahead and schedule and then she'll represent for a full preoperative consult before hand.

## 2012-09-16 NOTE — Patient Instructions (Addendum)
Take Tindamax medication for pills one day and 4 pills the next day. Avoid alcohol. Office will call you to arrange tubal sterilization.

## 2012-09-26 ENCOUNTER — Encounter: Payer: Self-pay | Admitting: Gynecology

## 2012-09-26 ENCOUNTER — Ambulatory Visit (INDEPENDENT_AMBULATORY_CARE_PROVIDER_SITE_OTHER): Payer: BC Managed Care – PPO | Admitting: Gynecology

## 2012-09-26 DIAGNOSIS — N898 Other specified noninflammatory disorders of vagina: Secondary | ICD-10-CM

## 2012-09-26 DIAGNOSIS — Z302 Encounter for sterilization: Secondary | ICD-10-CM

## 2012-09-26 DIAGNOSIS — L293 Anogenital pruritus, unspecified: Secondary | ICD-10-CM

## 2012-09-26 MED ORDER — FLUCONAZOLE 150 MG PO TABS: 150.0000 mg | ORAL_TABLET | Freq: Once | ORAL | Status: DC

## 2012-09-26 NOTE — Progress Notes (Signed)
Tracy Bowen 12/17/1975 [id number]   Preoperative consult  Chief complaint: desires permanent sterilization  History of present illness: 36 y.o. [id number] current IUDs contraception who desires tubal sterilization. Patient was counseled as to alternatives to include pill, patch and ring, Depo-Provera, Implanon, continued IUD, vasectomy, female sterilization. The patient elects for laparoscopic tubal sterilization.  Past medical history,surgical history, medications, allergies, family history and social history were all reviewed and documented in the EPIC chart. ROS:  Was performed and pertinent positives and negatives are included in the history of present illness.  Exam:  [name] assistant General: well developed, well nourished female, no acute distress HEENT: normal  Lungs: clear to auscultation without wheezing, rales or rhonchi  Cardiac: regular rate without rubs, murmurs or gallops  Abdomen: soft, nontender without masses, guarding, rebound, organomegaly  Pelvic: external bus vagina: normal   Cervix: grossly normal, IUD string visualized  Uterus: normal size, midline and mobile, nontender  Adnexa: without masses or tenderness     Assessment/Plan:  36 y.o. [id number] [name] contraception who desires permanent sterilization with laparoscopic tubal sterilization. I reviewed with involved with the procedure to include the intraoperative postoperative courses, instrumentation, trocar placement, multiple port sites, insufflation, use of Falope-Rings with bipolar cautery back up. Absolute irreversible sterility associated with the procedure was reviewed but she also understands there are failures and the potential for pregnancy in the future. The risks of infection, prolonged antibiotics, incisional complications to include opening and draining of incisions, closure by secondary intention, long-term issues of cosmetics/keloid and hernia formation reviewed. The risk of hemorrhage necessitating  transfusion and the risks of transfusion discussed to include transfusion reaction hepatitis HIB mad cow disease and other unknown entities. The risk of inadvertent injury to internal organs including bowel, bladder, ureters, vessels and nerves, either immediately recognized or delay recognized, necessitating major exploratory reparative surgeries and future reparative surgeries including ostomy formation, bowel resection, bladder repair, ureteral damage repair was all discussed understood and accepted. Patient understands we'll remove her [name] at the time of surgery and that her menses will return to her regular pattern after the tubal sterilization. The issues of "post tubal syndrome" were reviewed and the potential for heavier more irregular menses discussed. Patient's questions were answered to satisfaction and she is ready to proceed with surgery.    Dara Lords MD, 10:28 AM 09/26/2012

## 2012-09-26 NOTE — H&P (Signed)
  Tracy Bowen 12/08/1976 [id number]   History and Physical  Chief complaint: desires permanent sterilization  History of present illness: 36 y.o. [id number] current IUDs contraception who desires tubal sterilization. Patient was counseled as to alternatives to include pill, patch and ring, Depo-Provera, Implanon, continued IUD, vasectomy, female sterilization. The patient elects for laparoscopic tubal sterilization.  Past medical history,surgical history, medications, allergies, family history and social history were all reviewed and documented in the EPIC chart. ROS:  Was performed and pertinent positives and negatives are included in the history of present illness.  Exam:  [name] assistant General: well developed, well nourished female, no acute distress HEENT: normal  Lungs: clear to auscultation without wheezing, rales or rhonchi  Cardiac: regular rate without rubs, murmurs or gallops  Abdomen: soft, nontender without masses, guarding, rebound, organomegaly  Pelvic: external bus vagina: normal   Cervix: grossly normal, IUD string visualized  Uterus: normal size, midline and mobile, nontender  Adnexa: without masses or tenderness     Assessment/Plan:  36 y.o. [id number] [name] contraception who desires permanent sterilization with laparoscopic tubal sterilization. I reviewed with involved with the procedure to include the intraoperative postoperative courses, instrumentation, trocar placement, multiple port sites, insufflation, use of Falope-Rings with bipolar cautery back up. Absolute irreversible sterility associated with the procedure was reviewed but she also understands there are failures and the potential for pregnancy in the future. The risks of infection, prolonged antibiotics, incisional complications to include opening and draining of incisions, closure by secondary intention, long-term issues of cosmetics/keloid and hernia formation reviewed. The risk of hemorrhage  necessitating transfusion and the risks of transfusion discussed to include transfusion reaction hepatitis HIB mad cow disease and other unknown entities. The risk of inadvertent injury to internal organs including bowel, bladder, ureters, vessels and nerves, either immediately recognized or delay recognized, necessitating major exploratory reparative surgeries and future reparative surgeries including ostomy formation, bowel resection, bladder repair, ureteral damage repair was all discussed understood and accepted. Patient understands we'll remove her [name] at the time of surgery and that her menses will return to her regular pattern after the tubal sterilization. The issues of "post tubal syndrome" were reviewed and the potential for heavier more irregular menses discussed. Patient's questions were answered to satisfaction and she is ready to proceed with surgery.     Dara Lords MD, 10:35 AM 09/26/2012

## 2012-09-26 NOTE — Patient Instructions (Addendum)
Followup for surgery as scheduled. 

## 2012-09-29 ENCOUNTER — Encounter (HOSPITAL_BASED_OUTPATIENT_CLINIC_OR_DEPARTMENT_OTHER): Payer: Self-pay | Admitting: *Deleted

## 2012-09-29 NOTE — Progress Notes (Signed)
NPO AFTER MN. ARRIVES AT 0615. PT TO GET LABS DONE PRIOR TO DOS (SHE STATES ON Tuesday AM 09-30-2012).

## 2012-09-30 ENCOUNTER — Ambulatory Visit (INDEPENDENT_AMBULATORY_CARE_PROVIDER_SITE_OTHER): Payer: BC Managed Care – PPO | Admitting: Internal Medicine

## 2012-09-30 VITALS — BP 132/86 | HR 80 | Temp 98.1°F | Resp 16 | Ht 71.0 in | Wt 276.0 lb

## 2012-09-30 DIAGNOSIS — Z23 Encounter for immunization: Secondary | ICD-10-CM

## 2012-09-30 DIAGNOSIS — Z0000 Encounter for general adult medical examination without abnormal findings: Secondary | ICD-10-CM

## 2012-09-30 LAB — CBC
HCT: 38.3 % (ref 36.0–46.0)
Hemoglobin: 12.8 g/dL (ref 12.0–15.0)
MCH: 28.8 pg (ref 26.0–34.0)
MCHC: 33.4 g/dL (ref 30.0–36.0)

## 2012-09-30 NOTE — Progress Notes (Signed)
  Tuberculosis Risk Questionnaire  1. Were you born outside the [place] in one of the following parts of the world:    [place], [place], [place], [place] or [place]?  No  2. Have you traveled outside the [place] and lived for more than one month in one of the following parts of the world:  [place], [place], [place], [place] or [place]?  No  3. Do you have a compromised immune system such as from any of the following conditions:  HIV/AIDS, organ or bone marrow transplantation, diabetes, immunosuppressive   medicines (e.g. Prednisone, Remicaide), leukemia, lymphoma, cancer of the   head or neck, gastrectomy or jejunal bypass, end-stage renal disease (on   dialysis), or silicosis?  No    4. Have you ever done one of the following:    Used crack cocaine, injected illegal drugs, worked or resided in jail or prison,   worked or resided at a homeless shelter, or worked as a healthcare worker in   direct contact with patients?  No  5. Have you ever been exposed to anyone with infectious tuberculosis?  No Tuberculosis Symptom Questionnaire  Do you currently have any of the following symptoms?  1. Unexplained cough lasting more than 3 weeks? No  Unexplained fever lasting more than 3 weeks. No   3. Night Sweats (sweating that leaves the bedclothes and sheets wet)   No  4. Shortness of Breath No  5. Chest Pain No  6. Unintentional weight loss  No  7. Unexplained fatigue (very tired for no reason) No 

## 2012-09-30 NOTE — Patient Instructions (Addendum)

## 2012-09-30 NOTE — Progress Notes (Signed)
  Subjective:    Patient ID: ROSAMAE ROCQUE, female    DOB: 1976-05-13, 36 y.o.   MRN: [id number]  HPI Healthy hx Overweight/hx of acl 2001   Review of Systems  Constitutional: Negative.   HENT: Negative.   Eyes: Negative.   Respiratory: Negative.   Cardiovascular: Negative.   Gastrointestinal: Negative.   Genitourinary: Negative.   Musculoskeletal: Positive for arthralgias.  Neurological: Negative.   Hematological: Negative.   Psychiatric/Behavioral: Negative.        Objective:   Physical Exam  Constitutional: She is oriented to person, place, and time. She appears well-nourished. No distress.  HENT:  Right Ear: External ear normal.  Left Ear: External ear normal.  Nose: Nose normal.  Mouth/Throat: Oropharynx is clear and moist.  Eyes: EOM are normal. Pupils are equal, round, and reactive to light.  Neck: Normal range of motion. Neck supple. No thyromegaly present.  Cardiovascular: Normal rate, regular rhythm and normal heart sounds.   Pulmonary/Chest: Effort normal and breath sounds normal.  Abdominal: Soft. Bowel sounds are normal.  Musculoskeletal: She exhibits tenderness.  Neurological: She is alert and oriented to person, place, and time. She has normal reflexes. No cranial nerve deficit. She exhibits normal muscle tone. Coordination normal.  Skin: Skin is warm and dry.  Psychiatric: She has a normal mood and affect.          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

## 2012-10-03 ENCOUNTER — Ambulatory Visit (HOSPITAL_BASED_OUTPATIENT_CLINIC_OR_DEPARTMENT_OTHER)
Admission: RE | Admit: 2012-10-03 | Discharge: 2012-10-03 | Disposition: A | Discharge status: Home / Self Care | Payer: BC Managed Care – PPO | Source: Ambulatory Visit | Attending: Gynecology | Admitting: Gynecology

## 2012-10-03 ENCOUNTER — Encounter (HOSPITAL_BASED_OUTPATIENT_CLINIC_OR_DEPARTMENT_OTHER)
Admission: RE | Disposition: A | Discharge status: Home / Self Care | Payer: Self-pay | Source: Ambulatory Visit | Attending: Gynecology

## 2012-10-03 ENCOUNTER — Encounter (HOSPITAL_BASED_OUTPATIENT_CLINIC_OR_DEPARTMENT_OTHER): Payer: Self-pay | Admitting: *Deleted

## 2012-10-03 ENCOUNTER — Ambulatory Visit (HOSPITAL_BASED_OUTPATIENT_CLINIC_OR_DEPARTMENT_OTHER): Payer: BC Managed Care – PPO | Admitting: Anesthesiology

## 2012-10-03 ENCOUNTER — Encounter (HOSPITAL_BASED_OUTPATIENT_CLINIC_OR_DEPARTMENT_OTHER): Payer: Self-pay | Admitting: Anesthesiology

## 2012-10-03 DIAGNOSIS — D252 Subserosal leiomyoma of uterus: Secondary | ICD-10-CM | POA: Insufficient documentation

## 2012-10-03 DIAGNOSIS — Z302 Encounter for sterilization: Secondary | ICD-10-CM | POA: Insufficient documentation

## 2012-10-03 HISTORY — DX: Gastro-esophageal reflux disease without esophagitis: K21.9

## 2012-10-03 HISTORY — DX: Other seasonal allergic rhinitis: J30.2

## 2012-10-03 HISTORY — PX: TUBAL LIGATION: SHX77

## 2012-10-03 SURGERY — LIGATION, FALLOPIAN TUBE, BILATERAL
Anesthesia: General | Site: Abdomen | Laterality: Bilateral | Wound class: Clean

## 2012-10-03 MED ORDER — LACTATED RINGERS IV SOLN: INTRAVENOUS | Status: DC

## 2012-10-03 MED ORDER — DEXAMETHASONE SODIUM PHOSPHATE 4 MG/ML IJ SOLN
INTRAMUSCULAR | Status: DC | PRN
Start: 2012-10-03 — End: 2012-10-03
  Administered 2012-10-03: 8 mg via INTRAVENOUS

## 2012-10-03 MED ORDER — ONDANSETRON HCL 4 MG/2ML IJ SOLN
INTRAMUSCULAR | Status: DC | PRN
  Administered 2012-10-03: 4 mg via INTRAVENOUS

## 2012-10-03 MED ORDER — OXYCODONE-ACETAMINOPHEN 5-325 MG PO TABS
1.0000 | ORAL_TABLET | Freq: Four times a day (QID) | ORAL | Status: AC | PRN
  Administered 2012-10-03: 1 via ORAL

## 2012-10-03 MED ORDER — LIDOCAINE HCL (CARDIAC) 20 MG/ML IV SOLN
INTRAVENOUS | Status: DC | PRN
  Administered 2012-10-03: 80 mg via INTRAVENOUS

## 2012-10-03 MED ORDER — OXYCODONE-ACETAMINOPHEN 5-325 MG PO TABS
2.0000 | ORAL_TABLET | Freq: Four times a day (QID) | ORAL | Status: DC | PRN
Start: 2012-10-03 — End: 2013-01-19

## 2012-10-03 MED ORDER — PROMETHAZINE HCL 25 MG/ML IJ SOLN: 6.2500 mg | INTRAMUSCULAR | Status: DC | PRN

## 2012-10-03 MED ORDER — BUPIVACAINE HCL (PF) 0.25 % IJ SOLN
INTRAMUSCULAR | Status: DC | PRN
  Administered 2012-10-03: 5 mL

## 2012-10-03 MED ORDER — DEXTROSE 5 % IV SOLN: 2.0000 g | INTRAVENOUS | Status: DC

## 2012-10-03 MED ORDER — MIDAZOLAM HCL 5 MG/5ML IJ SOLN
INTRAMUSCULAR | Status: DC | PRN
  Administered 2012-10-03: 2 mg via INTRAVENOUS

## 2012-10-03 MED ORDER — MEPERIDINE HCL 25 MG/ML IJ SOLN: 6.2500 mg | INTRAMUSCULAR | Status: DC | PRN

## 2012-10-03 MED ORDER — PROPOFOL 10 MG/ML IV BOLUS
INTRAVENOUS | Status: DC | PRN
  Administered 2012-10-03: 200 mg via INTRAVENOUS

## 2012-10-03 MED ORDER — FENTANYL CITRATE 0.05 MG/ML IJ SOLN
INTRAMUSCULAR | Status: DC | PRN
  Administered 2012-10-03 (×4): 50 ug via INTRAVENOUS

## 2012-10-03 MED ORDER — FENTANYL CITRATE 0.05 MG/ML IJ SOLN
25.0000 ug | INTRAMUSCULAR | Status: DC | PRN
  Administered 2012-10-03: 50 ug via INTRAVENOUS
  Administered 2012-10-03 (×2): 25 ug via INTRAVENOUS

## 2012-10-03 MED ORDER — DEXTROSE 5 % IV SOLN
2.0000 g | INTRAVENOUS | Status: DC | PRN
  Administered 2012-10-03: 2 g via INTRAVENOUS

## 2012-10-03 MED ORDER — GLYCOPYRROLATE 0.2 MG/ML IJ SOLN
INTRAMUSCULAR | Status: DC | PRN
Start: 2012-10-03 — End: 2012-10-03
  Administered 2012-10-03: 0.6 mg via INTRAVENOUS

## 2012-10-03 MED ORDER — DEXTROSE IN LACTATED RINGERS 5 % IV SOLN: INTRAVENOUS | Status: DC

## 2012-10-03 MED ORDER — KETOROLAC TROMETHAMINE 30 MG/ML IJ SOLN
INTRAMUSCULAR | Status: DC | PRN
  Administered 2012-10-03: 30 mg via INTRAVENOUS

## 2012-10-03 MED ORDER — ROCURONIUM BROMIDE 100 MG/10ML IV SOLN
INTRAVENOUS | Status: DC | PRN
  Administered 2012-10-03: 30 mg via INTRAVENOUS

## 2012-10-03 MED ORDER — SUCCINYLCHOLINE CHLORIDE 20 MG/ML IJ SOLN
INTRAMUSCULAR | Status: DC | PRN
Start: 2012-10-03 — End: 2012-10-03
  Administered 2012-10-03: 140 mg via INTRAVENOUS

## 2012-10-03 MED ORDER — LACTATED RINGERS IV SOLN
INTRAVENOUS | Status: DC
  Administered 2012-10-03: 100 mL/h via INTRAVENOUS

## 2012-10-03 MED ORDER — NEOSTIGMINE METHYLSULFATE 1 MG/ML IJ SOLN
INTRAMUSCULAR | Status: DC | PRN
  Administered 2012-10-03: 5 mg via INTRAVENOUS

## 2012-10-03 SURGICAL SUPPLY — 46 items
APPLICATOR COTTON TIP 6IN STRL (MISCELLANEOUS) ×2 IMPLANT
BAG URINE DRAINAGE (UROLOGICAL SUPPLIES) IMPLANT
BANDAGE ADHESIVE 1X3 (GAUZE/BANDAGES/DRESSINGS) IMPLANT
BLADE SURG 11 STRL SS (BLADE) ×2 IMPLANT
CATH FOLEY 2WAY SLVR  5CC 16FR (CATHETERS)
CATH FOLEY 2WAY SLVR 5CC 16FR (CATHETERS) IMPLANT
CATH ROBINSON RED A/P 16FR (CATHETERS) ×2 IMPLANT
CLOTH BEACON ORANGE TIMEOUT ST (SAFETY) ×2 IMPLANT
DERMABOND ADVANCED (GAUZE/BANDAGES/DRESSINGS)
DERMABOND ADVANCED .7 DNX12 (GAUZE/BANDAGES/DRESSINGS) IMPLANT
DRAPE CAMERA CLOSED 9X96 (DRAPES) ×2 IMPLANT
DRAPE UNDERBUTTOCKS STRL (DRAPE) ×2 IMPLANT
DRESSING TELFA 8X3 (GAUZE/BANDAGES/DRESSINGS) IMPLANT
ELECT REM PT RETURN 9FT ADLT (ELECTROSURGICAL) ×2
ELECTRODE REM PT RTRN 9FT ADLT (ELECTROSURGICAL) ×1 IMPLANT
FORCEPS BIOP CUTTING (INSTRUMENTS) IMPLANT
GLOVE BIO SURGEON STRL SZ 6.5 (GLOVE) ×2 IMPLANT
GLOVE BIO SURGEON STRL SZ7.5 (GLOVE) ×4 IMPLANT
GLOVE ECLIPSE 6.0 STRL STRAW (GLOVE) ×2 IMPLANT
GOWN PREVENTION PLUS LG XLONG (DISPOSABLE) ×2 IMPLANT
GOWN STRL REIN XL XLG (GOWN DISPOSABLE) ×2 IMPLANT
NEEDLE HYPO 25X1 1.5 SAFETY (NEEDLE) ×2 IMPLANT
NEEDLE INSUFFLATION 14GA 120MM (NEEDLE) IMPLANT
NS IRRIG 500ML POUR BTL (IV SOLUTION) ×2 IMPLANT
PACK BASIN DAY SURGERY FS (CUSTOM PROCEDURE TRAY) ×2 IMPLANT
PACK LAPAROSCOPY II (CUSTOM PROCEDURE TRAY) ×2 IMPLANT
PAD OB MATERNITY 4.3X12.25 (PERSONAL CARE ITEMS) ×2 IMPLANT
PAD PREP 24X48 CUFFED NSTRL (MISCELLANEOUS) ×2 IMPLANT
RING FALLOPIAN BANDS (Ring) ×2 IMPLANT
SET IRRIG TUBING LAPAROSCOPIC (IRRIGATION / IRRIGATOR) IMPLANT
SOLUTION ANTI FOG 6CC (MISCELLANEOUS) ×2 IMPLANT
SOLUTION ELECTROLUBE (MISCELLANEOUS) IMPLANT
STRIP CLOSURE SKIN 1/4X4 (GAUZE/BANDAGES/DRESSINGS) IMPLANT
SUT PLAIN 4 0 FS 2 27 (SUTURE) IMPLANT
SUT VICRYL 0 UR6 27IN ABS (SUTURE) ×2 IMPLANT
SYR 3ML 23GX1 SAFETY (SYRINGE) IMPLANT
SYR CONTROL 10ML LL (SYRINGE) ×2 IMPLANT
SYRINGE 10CC LL (SYRINGE) IMPLANT
TOWEL OR 17X24 6PK STRL BLUE (TOWEL DISPOSABLE) ×4 IMPLANT
TRAY DSU PREP LF (CUSTOM PROCEDURE TRAY) ×2 IMPLANT
TROCAR 12M 150ML BLUNT (TROCAR) IMPLANT
TROCAR 5M 150ML BLDLS (TROCAR) IMPLANT
TROCAR XCEL BLUNT TIP 100MML (ENDOMECHANICALS) IMPLANT
TROCAR XCEL NON-BLD 11X100MML (ENDOMECHANICALS) ×2 IMPLANT
TUBING INSUFFLATION W/FILTER (TUBING) ×2 IMPLANT
WATER STERILE IRR 500ML POUR (IV SOLUTION) ×2 IMPLANT

## 2012-10-03 NOTE — Op Note (Signed)
Tracy Bowen 09/13/1976 [id number]   Post Operative Note   Date of surgery:  10/03/2012  Pre Op Dx:  Desires permanent sterilization  Post Op Dx:  Desires permanent sterilization, leiomyoma  Procedure:  Laparoscopic bilateral tubal sterilization, Falope ring technique. Removal of [name]  Surgeon:  Colin Broach  Anesthesia:  General  EBL:  minimal  Complications:  None  Specimen:  [name] gross only to pathology  Findings: EUA:  External BUS vagina normal. Cervix normal. Uterus grossly normal size midline mobile. Adnexa without gross masses.   Operative:   Anterior cul-de-sac normal. Posterior cul-de-sac normal. Uterus with multiple small subserosal myomas.  Larger fundal myoma. Right/left ovaries grossly normal free and mobile. Right/left fallopian tubes normal length caliber and fimbriated ends. A single Falope ring applied to each mid tubal segment.  Upper abdominal exam liver smooth gallbladder not visualized. Appendix grossly normal free and mobile. No upper abdominal adhesive disease noted.  Procedure:  The patient was taken to the operating room, underwent general anesthesia, placed in the low dorsal lithotomy position, received abdominal/perineal/vaginal preparation with Betadine solution, bladder emptied with an in and out Foley catheterization, EUA performed. A time out was performed by the surgical team. The cervix was visualized with a speculum, the [name] string was grasped and the [name] was removed and sent to pathology for gross only. A Hulka tenaculum was placed on the cervix. The patient was draped in usual fashion. A repeat infraumbilical vertical incision was made, the 10 mm Optiview direct entry trocar placed under direct visualization without difficulty and the abdomen insufflated. A midline suprapubic Falope ring applicator trocar was placed under direct visualization after transillumination of the vessels without difficulty. Examination of the  pelvic organs and upper abdominal exam was carried out with findings noted above. The right fallopian tube was identified, traced from its insertion to its fimbriated end and a mid tubal segment was drawn into the Falope ring applier and a single Falope ring was applied. A good knuckle of tube was within the ring, appropriate blanching occurred afterwards and manipulation assured secure placement. A similar procedure was carried out on the other side. The suprapubic port was removed, the gas slowly allowed to escape, hemostasis visualized at the port site and the infraumbilical port was backed out under direct visualization assuring adequate hemostasis and no evidence of hernia formation.  Both skin incisions were injected using 0.25% Marcaine. The infraumbilical port was closed using 0 Vicryl in an interrupted subcutaneous fascial stitch. The skin incisions were closed using Dermabond skin adhesive. The Hulka tenaculum was removed. The patient received intraoperative Toradol, awakened without difficulty and taken to the recovery room in good condition having tolerated the procedure well.    Dara Lords MD, 9:00 AM 10/03/2012

## 2012-10-03 NOTE — H&P (Signed)
  The patient was examined.  I reviewed the proposed surgery and consent form with the patient.  The dictated history and physical is current and accurate and all questions were answered. The patient is ready to proceed with surgery and has a realistic understanding and expectation for the outcome.   Dara Lords MD, 7:17 AM 10/03/2012

## 2012-10-03 NOTE — Anesthesia Postprocedure Evaluation (Signed)
  Anesthesia Post-op Note  Patient: Tracy Bowen  Procedure(s) Performed: Procedure(s) (LRB): BILATERAL TUBAL LIGATION (Bilateral)  Patient Location: PACU  Anesthesia Type: General  Level of Consciousness: awake and alert   Airway and Oxygen Therapy: Patient Spontanous Breathing  Post-op Pain: mild  Post-op Assessment: Post-op Vital signs reviewed, Patient's Cardiovascular Status Stable, Respiratory Function Stable, Patent Airway and No signs of Nausea or vomiting  Post-op Vital Signs: stable  Complications: No apparent anesthesia complications

## 2012-10-03 NOTE — Anesthesia Preprocedure Evaluation (Addendum)
Anesthesia Evaluation  Patient identified by MRN, date of birth, ID band Patient awake    Reviewed: Allergy & Precautions, H&P , NPO status , Patient's Chart, lab work & pertinent test results  Airway Mallampati: I TM Distance: >3 FB Neck ROM: Full    Dental No notable dental hx.    Pulmonary neg pulmonary ROS, sleep apnea and Continuous Positive Airway Pressure Ventilation ,  breath sounds clear to auscultation  Pulmonary exam normal       Cardiovascular negative cardio ROS  Rhythm:Regular Rate:Normal     Neuro/Psych PSYCHIATRIC DISORDERS Anxiety Depression negative neurological ROS  negative psych ROS   GI/Hepatic negative GI ROS, Neg liver ROS,   Endo/Other  negative endocrine ROS  Renal/GU negative Renal ROS  negative genitourinary   Musculoskeletal negative musculoskeletal ROS (+)   Abdominal   Peds negative pediatric ROS (+)  Hematology negative hematology ROS (+)   Anesthesia Other Findings   Reproductive/Obstetrics negative OB ROS                          Anesthesia Physical Anesthesia Plan  ASA: II  Anesthesia Plan: General   Post-op Pain Management:    Induction: Intravenous  Airway Management Planned: LMA and Oral ETT  Additional Equipment:   Intra-op Plan:   Post-operative Plan: Extubation in OR  Informed Consent: I have reviewed the patients History and Physical, chart, labs and discussed the procedure including the risks, benefits and alternatives for the proposed anesthesia with the patient or authorized representative who has indicated his/her understanding and acceptance.   Dental advisory given  Plan Discussed with: CRNA  Anesthesia Plan Comments:         Anesthesia Quick Evaluation

## 2012-10-03 NOTE — Transfer of Care (Addendum)
Immediate Anesthesia Transfer of Care Note  Patient: Tracy Bowen  Procedure(s) Performed: Procedure(s) (LRB): BILATERAL TUBAL LIGATION (Bilateral)  Patient Location: PACU  Anesthesia Type: General  Level of Consciousness: drowsy, follows commands, arouses to name.  Airway & Oxygen Therapy: Patient Spontanous Breathing and Patient connected to face mask oxygen, oral airway removed prior to transport to PACU.  Post-op Assessment: Report given to PACU RN and Post -op Vital signs reviewed and stable  Post vital signs: Reviewed and stable  Complications: No apparent anesthesia complications

## 2012-10-06 ENCOUNTER — Encounter (HOSPITAL_BASED_OUTPATIENT_CLINIC_OR_DEPARTMENT_OTHER): Payer: Self-pay | Admitting: Gynecology

## 2012-10-17 ENCOUNTER — Ambulatory Visit (INDEPENDENT_AMBULATORY_CARE_PROVIDER_SITE_OTHER): Payer: BC Managed Care – PPO | Admitting: Gynecology

## 2012-10-17 ENCOUNTER — Encounter: Payer: Self-pay | Admitting: Gynecology

## 2012-10-17 DIAGNOSIS — D259 Leiomyoma of uterus, unspecified: Secondary | ICD-10-CM

## 2012-10-17 DIAGNOSIS — Z9889 Other specified postprocedural states: Secondary | ICD-10-CM

## 2012-10-17 NOTE — Progress Notes (Signed)
Patient presents postoperative from laparoscopic tubal sterilization, Falope ring and removal of her [name]. She's done well without complaints.  Exam was can assistant Abdomen soft nontender without masses guarding rebound organomegaly. Incisions well-healed. Pelvic external BUS vagina normal bimanual without masses or tenderness  Assessment and plan: Normal postoperative check. Will keep menstrual calendar and follow up in June 2014 when due for her annual exam. I reviewed findings of pathology to include leiomyoma. Assuming her menses are regular and light then we'll follow if they become progressively heavier crampier or irregular bleeding then she knows to represent for evaluation

## 2012-10-17 NOTE — Patient Instructions (Signed)
Follow up June 2014 for annual exam, sooner if any issues  Uterine Fibroid A uterine fibroid is a growth (tumor) that occurs in a woman's uterus. This type of tumor is not cancerous and does not spread out of the uterus. A woman can have one or many fibroids, and the fiboid(s) can become quite large. A fibroid can vary in size, weight, and where it grows in the uterus. Most fibroids do not require medical treatment, but some can cause pain or heavy bleeding during and between periods. CAUSES  A fibroid is the result of a single uterine cell that keeps growing (unregulated), which is different than most cells in the human body. Most cells have a control mechanism that keeps them from reproducing without control.  SYMPTOMS   Bleeding.  Pelvic pain and pressure.  Bladder problems due to the size of the fibroid.  Infertility and miscarriages depending on the size and location of the fibroid. DIAGNOSIS  A diagnosis is made by physical exam. Your caregiver may feel the lumpy tumors during a pelvic exam. Important information regarding size, location, and number of tumors can be gained by having an ultrasound. It is rare that other tests, such as a CT scan or MRI, are needed. TREATMENT   Your caregiver may recommend watchful waiting. This involves getting the fibroid checked by your caregiver to see if the fibroids grow or shrink.   Hormonal treatment or an intrauterine device (IUD) may be prescribed.   Surgery may be needed to remove the fibroids (myomectomy) or the uterus (hysterectomy). This depends on your situation. When fibroids interfere with fertility and a woman wants to become pregnant, a caregiver may recommend having the fibroids removed.  HOME CARE INSTRUCTIONS  Home care depends on how you were treated. In general:   Keep all follow-up appointments with your caregiver.   Only take medicine as told by your caregiver. Do not take aspirin. It can cause bleeding.   If you have  excessive periods and soak tampons or pads in a half hour or less, contact your caregiver immediately. If your periods are troublesome but not so heavy, lie down with your feet raised slightly above your heart. Place cold packs on your lower abdomen.   If your periods are heavy, write down the number of pads or tampons you use per month. Bring this information to your caregiver.   Talk to your caregiver about taking iron pills.   Include green vegetables in your diet.   If you were prescribed a hormonal treatment, take the hormonal medicines as directed.   If you need surgery, ask your caregiver for information on your specific surgery.  SEEK IMMEDIATE MEDICAL CARE IF:  You have pelvic pain or cramps not controlled with medicines.   You have a sudden increase in pelvic pain.   You have an increase of bleeding between and during periods.   You feel lightheaded or have fainting episodes.  MAKE SURE YOU:  Understand these instructions.  Will watch your condition.  Will get help right away if you are not doing well or get worse. Document Released: 11/23/2000 Document Revised: 02/18/2012 Document Reviewed: 12/17/2011 [facility] Patient Information 2013 [place], [place].

## 2013-01-19 ENCOUNTER — Encounter: Payer: Self-pay | Admitting: Gynecology

## 2013-01-19 ENCOUNTER — Ambulatory Visit (INDEPENDENT_AMBULATORY_CARE_PROVIDER_SITE_OTHER): Payer: BC Managed Care – PPO | Admitting: Gynecology

## 2013-01-19 DIAGNOSIS — N898 Other specified noninflammatory disorders of vagina: Secondary | ICD-10-CM

## 2013-01-19 DIAGNOSIS — A499 Bacterial infection, unspecified: Secondary | ICD-10-CM

## 2013-01-19 DIAGNOSIS — N760 Acute vaginitis: Secondary | ICD-10-CM

## 2013-01-19 LAB — WET PREP FOR TRICH, YEAST, CLUE
Trich, Wet Prep: NONE SEEN
Yeast Wet Prep HPF POC: NONE SEEN

## 2013-01-19 MED ORDER — METRONIDAZOLE 500 MG PO TABS: 500.0000 mg | ORAL_TABLET | Freq: Two times a day (BID) | ORAL | Status: DC

## 2013-01-19 NOTE — Progress Notes (Signed)
Patient presents with several days of vaginal discharge with odor. Does have history of BV and yeast infections in the past. Also noted some abdominal bloating.  Exam with [name] assistant Abdomen soft nontender without masses guarding rebound organomegaly. Pelvic external BUS vagina with darkish discharge. Cervix normal. Uterus normal sized mobile nontender. Adnexa without masses or tenderness.  Assessment and plan: History and wet prep consistent with Bactroban stenosis. We'll treat with Flagyl 500 mg twice a day x7 days, alcohol avoidance discussed. Follow up if symptoms persist worsen or recur. Discussed her bloating over the last several days. We'll try pushing fluids with Metamucil. No nausea vomiting diarrhea. Does note some constipation. If symptoms persist I recommended follow up with GI and she agrees to do so.

## 2013-01-19 NOTE — Patient Instructions (Signed)
Take Flagyl medication twice daily for 7 days. Follow up if your discharge persists, worsens or recurs. If your intestinal bloating continues recommend follow up with gastroenterology.

## 2013-02-05 ENCOUNTER — Telehealth: Payer: Self-pay | Admitting: *Deleted

## 2013-02-05 NOTE — Telephone Encounter (Signed)
Pt was seen on 01/19/13 treatment for BV given with Flagyl 500 mg twice a day x7 days. Pt completed all medication and the symptoms have returned, she doesn't believe it fully went away. Pt asked if another round could be given? Please advise

## 2013-02-05 NOTE — Telephone Encounter (Signed)
Recommend approaching it from a different angle. Cleocin vaginal cream nightly x7 days dispense one kit

## 2013-02-06 MED ORDER — CLINDAMYCIN PHOSPHATE 2 % VA CREA: TOPICAL_CREAM | VAGINAL | Status: DC

## 2013-02-06 NOTE — Telephone Encounter (Signed)
Pt informed with the below note, rx sent. 

## 2013-03-31 ENCOUNTER — Ambulatory Visit (INDEPENDENT_AMBULATORY_CARE_PROVIDER_SITE_OTHER): Payer: BC Managed Care – PPO | Admitting: Gynecology

## 2013-03-31 ENCOUNTER — Encounter: Payer: Self-pay | Admitting: Gynecology

## 2013-03-31 VITALS — BP 126/80

## 2013-03-31 DIAGNOSIS — A499 Bacterial infection, unspecified: Secondary | ICD-10-CM

## 2013-03-31 DIAGNOSIS — N760 Acute vaginitis: Secondary | ICD-10-CM

## 2013-03-31 DIAGNOSIS — B9689 Other specified bacterial agents as the cause of diseases classified elsewhere: Secondary | ICD-10-CM

## 2013-03-31 DIAGNOSIS — N898 Other specified noninflammatory disorders of vagina: Secondary | ICD-10-CM

## 2013-03-31 LAB — WET PREP FOR TRICH, YEAST, CLUE

## 2013-03-31 MED ORDER — TINIDAZOLE 500 MG PO TABS: ORAL_TABLET | ORAL | Status: DC

## 2013-03-31 NOTE — Patient Instructions (Addendum)
Bacterial Vaginosis Bacterial vaginosis (BV) is a vaginal infection where the normal balance of bacteria in the vagina is disrupted. The normal balance is then replaced by an overgrowth of certain bacteria. There are several different kinds of bacteria that can cause BV. BV is the most common vaginal infection in women of childbearing age. CAUSES   The cause of BV is not fully understood. BV develops when there is an increase or imbalance of harmful bacteria.  Some activities or behaviors can upset the normal balance of bacteria in the vagina and put women at increased risk including:  Having a new sex partner or multiple sex partners.  Douching.  Using an intrauterine device (IUD) for contraception.  It is not clear what role sexual activity plays in the development of BV. However, women that have never had sexual intercourse are rarely infected with BV. Women do not get BV from toilet seats, bedding, swimming pools or from touching objects around them.  SYMPTOMS   Grey vaginal discharge.  A fish-like odor with discharge, especially after sexual intercourse.  Itching or burning of the vagina and vulva.  Burning or pain with urination.  Some women have no signs or symptoms at all. DIAGNOSIS  Your caregiver must examine the vagina for signs of BV. Your caregiver will perform lab tests and look at the sample of vaginal fluid through a microscope. They will look for bacteria and abnormal cells (clue cells), a pH test higher than 4.5, and a positive amine test all associated with BV.  RISKS AND COMPLICATIONS   Pelvic inflammatory disease (PID).  Infections following gynecology surgery.  Developing HIV.  Developing herpes virus. TREATMENT  Sometimes BV will clear up without treatment. However, all women with symptoms of BV should be treated to avoid complications, especially if gynecology surgery is planned. Female partners generally do not need to be treated. However, BV may spread  between female sex partners so treatment is helpful in preventing a recurrence of BV.   BV may be treated with antibiotics. The antibiotics come in either pill or vaginal cream forms. Either can be used with nonpregnant or pregnant women, but the recommended dosages differ. These antibiotics are not harmful to the baby.  BV can recur after treatment. If this happens, a second round of antibiotics will often be prescribed.  Treatment is important for pregnant women. If not treated, BV can cause a premature delivery, especially for a pregnant woman who had a premature birth in the past. All pregnant women who have symptoms of BV should be checked and treated.  For chronic reoccurrence of BV, treatment with a type of prescribed gel vaginally twice a week is helpful. HOME CARE INSTRUCTIONS   Finish all medication as directed by your caregiver.  Do not have sex until treatment is completed.  Tell your sexual partner that you have a vaginal infection. They should see their caregiver and be treated if they have problems, such as a mild rash or itching.  Practice safe sex. Use condoms. Only have 1 sex partner. PREVENTION  Basic prevention steps can help reduce the risk of upsetting the natural balance of bacteria in the vagina and developing BV:  Do not have sexual intercourse (be abstinent).  Do not douche.  Use all of the medicine prescribed for treatment of BV, even if the signs and symptoms go away.  Tell your sex partner if you have BV. That way, they can be treated, if needed, to prevent reoccurrence. SEEK MEDICAL CARE IF:     Your symptoms are not improving after 3 days of treatment.  You have increased discharge, pain, or fever. MAKE SURE YOU:   Understand these instructions.  Will watch your condition.  Will get help right away if you are not doing well or get worse. FOR MORE INFORMATION  Division of STD Prevention (DSTDP), Centers for Disease Control and Prevention:  www.cdc.gov/std American Social Health Association (ASHA): www.ashastd.org  Document Released: 11/26/2005 Document Revised: 02/18/2012 Document Reviewed: 05/19/2009 [employer] Patient Information 2013 [employer].  

## 2013-03-31 NOTE — Progress Notes (Signed)
Patient presented to the office today complaining of a vaginal discharge with some external pruritus and had a fishy like odor. Patient is not sexually active. Patient is having normal menstrual cycles. Patient's had previous tubal sterilization procedure.  Exam: Bartholin urethra Skene was within normal limits Vagina: Clear discharge with fishy like odor Cervix: No gross lesions on inspection Bimanual exam not done Rectal exam not done  Wet prep: Pos Amine, many clue cells, too numerous to count bacteria  Assessment/plan: Patient states that this is the second or third time that she has had bacterial vaginosis within a years time. She will be placed on Tindamax 500 mg one by mouth twice a day for 2 weeks. I've given her literature information on Rephresh probiotic gel, suppository or oral supplement that she can use after these 2 weeks.

## 2013-05-08 ENCOUNTER — Telehealth: Payer: Self-pay | Admitting: *Deleted

## 2013-05-08 MED ORDER — METRONIDAZOLE 500 MG PO TABS: ORAL_TABLET | ORAL | Status: DC

## 2013-05-08 MED ORDER — METRONIDAZOLE 500 MG PO TABS: 500.0000 mg | ORAL_TABLET | Freq: Two times a day (BID) | ORAL | Status: DC

## 2013-05-08 NOTE — Telephone Encounter (Signed)
Pt calling c/o BV symtpoms again, discharge with odor. She gets them every month right before her period. SHe was treated by JF a month ago with Tindamax and saw you in Feb treated with Flagyl. Pt requesting prescription to use before period each month. PLs advise. KW

## 2013-05-08 NOTE — Telephone Encounter (Signed)
Flagyl 500 mg twice a day x7 days, avoid alcohol while taking. Separate supply of Flagyl 500 mg #30 one by mouth twice a day x2 days at the earliest onset of symptoms before her menses to see if this doesn't abort the recurrence.

## 2013-05-08 NOTE — Telephone Encounter (Signed)
Pt informed and Rxs sent KW 

## 2013-06-19 ENCOUNTER — Ambulatory Visit (INDEPENDENT_AMBULATORY_CARE_PROVIDER_SITE_OTHER): Payer: BC Managed Care – PPO | Admitting: Women's Health

## 2013-06-19 DIAGNOSIS — B373 Candidiasis of vulva and vagina: Secondary | ICD-10-CM

## 2013-06-19 DIAGNOSIS — N898 Other specified noninflammatory disorders of vagina: Secondary | ICD-10-CM

## 2013-06-19 LAB — WET PREP FOR TRICH, YEAST, CLUE
Clue Cells Wet Prep HPF POC: NONE SEEN
Trich, Wet Prep: NONE SEEN

## 2013-06-19 MED ORDER — TERCONAZOLE 0.8 % VA CREA: 1.0000 | TOPICAL_CREAM | Freq: Every day | VAGINAL | Status: DC

## 2013-06-19 MED ORDER — FLUCONAZOLE 150 MG PO TABS: 150.0000 mg | ORAL_TABLET | Freq: Once | ORAL | Status: DC

## 2013-06-19 NOTE — Progress Notes (Signed)
Patient ID: Tracy Bowen, female   DOB: [birth year], 37 y.o.   MRN: [id number] Resistance with complaint of external vaginal itching, white discharge. Monthly cycle/ BTL/ same partner. Denies any urinary symptoms, abdominal pain or fever.  Exam: Appears well, obese, external genitalia erythematous at introitus, speculum exam copious curdy white discharge noted. wet prep: positive for many yeast.  Yeast vaginitis  Plan: Diflucan 150 by mouth x1 dose, Terazol 3 one applicator at bedtime x3. Instructed to call if no relief. Keep scheduled annual exam appointment with Dr. Audie Box in August. Declined handout on yeast.

## 2013-07-23 ENCOUNTER — Encounter: Payer: Self-pay | Admitting: Gynecology

## 2013-07-23 ENCOUNTER — Ambulatory Visit (INDEPENDENT_AMBULATORY_CARE_PROVIDER_SITE_OTHER): Payer: BC Managed Care – PPO | Admitting: Gynecology

## 2013-07-23 VITALS — BP 120/76 | Ht 70.0 in | Wt 274.0 lb

## 2013-07-23 DIAGNOSIS — N760 Acute vaginitis: Secondary | ICD-10-CM

## 2013-07-23 DIAGNOSIS — Z13220 Encounter for screening for lipoid disorders: Secondary | ICD-10-CM

## 2013-07-23 DIAGNOSIS — Z01419 Encounter for gynecological examination (general) (routine) without abnormal findings: Secondary | ICD-10-CM

## 2013-07-23 LAB — WET PREP FOR TRICH, YEAST, CLUE
Trich, Wet Prep: NONE SEEN
Yeast Wet Prep HPF POC: NONE SEEN

## 2013-07-23 LAB — CBC WITH DIFFERENTIAL/PLATELET
Basophils Absolute: 0 10*3/uL (ref 0.0–0.1)
Basophils Relative: 0 % (ref 0–1)
Lymphocytes Relative: 31 % (ref 12–46)
MCHC: 33.8 g/dL (ref 30.0–36.0)
Neutro Abs: 4.2 10*3/uL (ref 1.7–7.7)
Platelets: 242 10*3/uL (ref 150–400)
RDW: 15.2 % (ref 11.5–15.5)
WBC: 7 10*3/uL (ref 4.0–10.5)

## 2013-07-23 LAB — LIPID PANEL
HDL: 64 mg/dL (ref >39)
LDL Cholesterol: 104 mg/dL — ABNORMAL HIGH (ref 0–99)
Total CHOL/HDL Ratio: 3.1 Ratio

## 2013-07-23 LAB — COMPREHENSIVE METABOLIC PANEL
ALT: 13 U/L (ref 0–35)
AST: 16 U/L (ref 0–37)
Albumin: 4.3 g/dL (ref 3.5–5.2)
Alkaline Phosphatase: 102 U/L (ref 39–117)
BUN: 8 mg/dL (ref 6–23)
Calcium: 9.5 mg/dL (ref 8.4–10.5)
Chloride: 103 mEq/L (ref 96–112)
Creat: 1.04 mg/dL (ref 0.50–1.10)
Potassium: 4.1 mEq/L (ref 3.5–5.3)

## 2013-07-23 MED ORDER — METRONIDAZOLE 500 MG PO TABS: 500.0000 mg | ORAL_TABLET | Freq: Two times a day (BID) | ORAL | Status: DC

## 2013-07-23 NOTE — Patient Instructions (Signed)
Take Flagyl medication twice daily for 7 days. Avoid alcohol while taking. Followup in one year, sooner as needed.

## 2013-07-23 NOTE — Addendum Note (Signed)
Addended by: Dayna Barker on: 07/23/2013 04:24 PM   Modules accepted: Orders

## 2013-07-23 NOTE — Progress Notes (Signed)
Tracy Bowen [birth year] [id number]        37 y.o.  [id number] for annual exam.  Doing well. Does note a slight vaginal discharge with irritation.  Past medical history,surgical history, medications, allergies, family history and social history were all reviewed and documented in the EPIC chart.  ROS:  Performed and pertinent positives and negatives are included in the history, assessment and plan .  Exam: [name] assistant Filed Vitals:   07/23/13 1529  BP: 120/76  Height: 5\' 10"  (1.778 m)  Weight: 274 lb (124.286 kg)   General appearance  Normal Skin grossly normal Head/Neck normal with no cervical or supraclavicular adenopathy thyroid normal Lungs  clear Cardiac RR, without RMG Abdominal  soft, nontender, without masses, organomegaly or hernia Breasts  examined lying and sitting without masses, retractions, discharge or axillary adenopathy. Pelvic  Ext/BUS/vagina  normal with white discharge  Cervix  normal   Uterus  anteverted, normal size, shape and contour, midline and mobile nontender   Adnexa  Without masses or tenderness    Anus and perineum  normal   Rectovaginal  normal sphincter tone without palpated masses or tenderness.    Assessment/Plan:  37 y.o. [id number] female for annual exam, regular menses, tubal sterilization.   1. Vaginal discharge. Wet prep consistent with bacterial vaginosis. Flagyl 500 mg twice a day x7 days prescribed at her choice. Alcohol avoidance reviewed. 2. Pap smear 2012. No Pap smear done today. No history of abnormal Pap smears previously. Plan repeat next year a 3 year interval. 3. Mammography. Turns 37 this month. Screening mammographic recommendations between 35 and 40 reviewed.  Patient has a strong family history prefers to wait closer to 40. SBE monthly reviewed. 4. Health maintenance. Baseline CBC comprehensive metabolic panel lipid profile urinalysis ordered. Followup one year, sooner as needed.  Note: This document was prepared with  digital dictation and possible smart phrase technology. Any transcriptional errors that result from this process are unintentional.   Dara Lords MD, 4:15 PM 07/23/2013

## 2013-07-24 LAB — URINALYSIS W MICROSCOPIC + REFLEX CULTURE
Glucose, UA: NEGATIVE mg/dL
Hgb urine dipstick: NEGATIVE
Nitrite: NEGATIVE
Protein, ur: NEGATIVE mg/dL

## 2013-07-25 LAB — URINE CULTURE
Colony Count: NO GROWTH
Organism ID, Bacteria: NO GROWTH

## 2013-08-18 ENCOUNTER — Other Ambulatory Visit: Payer: Self-pay | Admitting: Women's Health

## 2013-08-19 NOTE — Telephone Encounter (Signed)
Okay for refill, office visit if no relief. 

## 2013-10-06 ENCOUNTER — Other Ambulatory Visit: Payer: Self-pay | Admitting: Gynecology

## 2013-10-07 ENCOUNTER — Encounter: Payer: Self-pay | Admitting: Gynecology

## 2013-10-07 ENCOUNTER — Ambulatory Visit (INDEPENDENT_AMBULATORY_CARE_PROVIDER_SITE_OTHER): Payer: BC Managed Care – PPO | Admitting: Gynecology

## 2013-10-07 DIAGNOSIS — B9689 Other specified bacterial agents as the cause of diseases classified elsewhere: Secondary | ICD-10-CM

## 2013-10-07 DIAGNOSIS — N760 Acute vaginitis: Secondary | ICD-10-CM

## 2013-10-07 DIAGNOSIS — A499 Bacterial infection, unspecified: Secondary | ICD-10-CM

## 2013-10-07 DIAGNOSIS — N898 Other specified noninflammatory disorders of vagina: Secondary | ICD-10-CM

## 2013-10-07 LAB — WET PREP FOR TRICH, YEAST, CLUE: Clue Cells Wet Prep HPF POC: NONE SEEN

## 2013-10-07 MED ORDER — METRONIDAZOLE 500 MG PO TABS: 500.0000 mg | ORAL_TABLET | Freq: Two times a day (BID) | ORAL | Status: DC

## 2013-10-07 NOTE — Patient Instructions (Signed)
Take Flagyl medication twice daily for 7 days. Avoid alcohol while taking. 

## 2013-10-07 NOTE — Progress Notes (Signed)
Patient presents with two-day history of vaginal discharge itching and odor. 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 Does not have precipitating events that are identified. No urinary symptoms.  Exam was Tracy Bowen Abdomen soft nontender without masses guarding rebound or organomegaly. Pelvic external BUS vagina with white to yellowish discharge. Cervix normal. Uterus normal size midline mobile nontender. Adnexa without masses or tenderness.  Assessment and plan: History, exam and wet prep consistent with bacterial vaginosis. Flagyl 500 mg twice a day x7 days. Options for trial of suppressive therapy versus treating recurrences reviewed. As she goes months without recurrence we'll plan on treating episodes. I gave her 2 refills of Flagyl to have available to self medicate if she has recurrence. Followup if symptoms persist, worsen or recur despite above.  Followup 07/2014 for annual exam when due.

## 2013-10-15 ENCOUNTER — Other Ambulatory Visit: Payer: Self-pay

## 2013-12-18 ENCOUNTER — Other Ambulatory Visit: Payer: Self-pay | Admitting: Gynecology

## 2014-01-22 ENCOUNTER — Other Ambulatory Visit: Payer: Self-pay | Admitting: Gynecology

## 2014-01-22 NOTE — Telephone Encounter (Signed)
Pt called as well stating she has recurrent BV infections per note 10/07/14 "Options for trial of suppressive therapy versus treating recurrences reviewed. As she goes months without recurrence we'll plan on treating episodes" please advise

## 2014-07-18 ENCOUNTER — Other Ambulatory Visit: Payer: Self-pay | Admitting: Gynecology

## 2014-07-19 NOTE — Telephone Encounter (Signed)
Your 10/07/13 note says " Assessment and plan: History, exam and wet prep consistent with bacterial vaginosis. Flagyl 500 mg twice a day x7 days. Options for trial of suppressive therapy versus treating recurrences reviewed. As she goes months without recurrence we'll plan on treating episodes. I gave her 2 refills of Flagyl to have available to self medicate if she has recurrence. Followup if symptoms persist, worsen or recur despite above. Followup 07/2014 for annual exam when due.

## 2014-08-13 ENCOUNTER — Encounter: Payer: Self-pay | Admitting: Gynecology

## 2014-08-13 ENCOUNTER — Ambulatory Visit (INDEPENDENT_AMBULATORY_CARE_PROVIDER_SITE_OTHER): Payer: BC Managed Care – PPO | Admitting: Gynecology

## 2014-08-13 ENCOUNTER — Other Ambulatory Visit (HOSPITAL_COMMUNITY)
Admission: RE | Admit: 2014-08-13 | Discharge: 2014-08-13 | Disposition: A | Discharge status: Home / Self Care | Payer: BC Managed Care – PPO | Source: Ambulatory Visit | Attending: Gynecology | Admitting: Gynecology

## 2014-08-13 VITALS — BP 120/76 | Ht 70.0 in | Wt 280.0 lb

## 2014-08-13 DIAGNOSIS — Z1151 Encounter for screening for human papillomavirus (HPV): Secondary | ICD-10-CM | POA: Insufficient documentation

## 2014-08-13 DIAGNOSIS — Z01419 Encounter for gynecological examination (general) (routine) without abnormal findings: Secondary | ICD-10-CM

## 2014-08-13 LAB — CBC WITH DIFFERENTIAL/PLATELET
BASOS ABS: 0 10*3/uL (ref 0.0–0.1)
BASOS PCT: 0 % (ref 0–1)
EOS PCT: 2 % (ref 0–5)
Eosinophils Absolute: 0.1 10*3/uL (ref 0.0–0.7)
HCT: 34.7 % — ABNORMAL LOW (ref 36.0–46.0)
Hemoglobin: 11.5 g/dL — ABNORMAL LOW (ref 12.0–15.0)
LYMPHS PCT: 37 % (ref 12–46)
Lymphs Abs: 2 10*3/uL (ref 0.7–4.0)
MCH: 27.9 pg (ref 26.0–34.0)
MCHC: 33.1 g/dL (ref 30.0–36.0)
MCV: 84.2 fL (ref 78.0–100.0)
Monocytes Absolute: 0.3 10*3/uL (ref 0.1–1.0)
Monocytes Relative: 6 % (ref 3–12)
NEUTROS ABS: 3 10*3/uL (ref 1.7–7.7)
Neutrophils Relative %: 55 % (ref 43–77)
PLATELETS: 238 10*3/uL (ref 150–400)
RBC: 4.12 MIL/uL (ref 3.87–5.11)
RDW: 14.7 % (ref 11.5–15.5)
WBC: 5.5 10*3/uL (ref 4.0–10.5)

## 2014-08-13 NOTE — Patient Instructions (Signed)
You may obtain a copy of any labs that were done today by logging onto MyChart as outlined in the instructions provided with your AVS (after visit summary). The office will not call with normal lab results but certainly if there are any significant abnormalities then we will contact you.   Health Maintenance, Female A healthy lifestyle and preventative care can promote health and wellness.  Maintain regular health, dental, and eye exams.  Eat a healthy diet. Foods like vegetables, fruits, whole grains, low-fat dairy products, and lean protein foods contain the nutrients you need without too many calories. Decrease your intake of foods high in solid fats, added sugars, and salt. Get information about a proper diet from your caregiver, if necessary.  Regular physical exercise is one of the most important things you can do for your health. Most adults should get at least 150 minutes of moderate-intensity exercise (any activity that increases your heart rate and causes you to sweat) each week. In addition, most adults need muscle-strengthening exercises on 2 or more days a week.   Maintain a healthy weight. The body mass index (BMI) is a screening tool to identify possible weight problems. It provides an estimate of body fat based on height and weight. Your caregiver can help determine your BMI, and can help you achieve or maintain a healthy weight. For adults 20 years and older:  A BMI below 18.5 is considered underweight.  A BMI of 18.5 to 24.9 is normal.  A BMI of 25 to 29.9 is considered overweight.  A BMI of 30 and above is considered obese.  Maintain normal blood lipids and cholesterol by exercising and minimizing your intake of saturated fat. Eat a balanced diet with plenty of fruits and vegetables. Blood tests for lipids and cholesterol should begin at age 61 and be repeated every 5 years. If your lipid or cholesterol levels are high, you are over 50, or you are a high risk for heart  disease, you may need your cholesterol levels checked more frequently.Ongoing high lipid and cholesterol levels should be treated with medicines if diet and exercise are not effective.  If you smoke, find out from your caregiver how to quit. If you do not use tobacco, do not start.  Lung cancer screening is recommended for adults aged 33 80 years who are at high risk for developing lung cancer because of a history of smoking. Yearly low-dose computed tomography (CT) is recommended for people who have at least a 30-pack-year history of smoking and are a current smoker or have quit within the past 15 years. A pack year of smoking is smoking an average of 1 pack of cigarettes a day for 1 year (for example: 1 pack a day for 30 years or 2 packs a day for 15 years). Yearly screening should continue until the smoker has stopped smoking for at least 15 years. Yearly screening should also be stopped for people who develop a health problem that would prevent them from having lung cancer treatment.  If you are pregnant, do not drink alcohol. If you are breastfeeding, be very cautious about drinking alcohol. If you are not pregnant and choose to drink alcohol, do not exceed 1 drink per day. One drink is considered to be 12 ounces (355 mL) of beer, 5 ounces (148 mL) of wine, or 1.5 ounces (44 mL) of liquor.  Avoid use of street drugs. Do not share needles with anyone. Ask for help if you need support or instructions about stopping  the use of drugs.  High blood pressure causes heart disease and increases the risk of stroke. Blood pressure should be checked at least every 1 to 2 years. Ongoing high blood pressure should be treated with medicines, if weight loss and exercise are not effective.  If you are 59 to 36 years old, ask your caregiver if you should take aspirin to prevent strokes.  Diabetes screening involves taking a blood sample to check your fasting blood sugar level. This should be done once every 3  years, after age 91, if you are within normal weight and without risk factors for diabetes. Testing should be considered at a younger age or be carried out more frequently if you are overweight and have at least 1 risk factor for diabetes.  Breast cancer screening is essential preventative care for women. You should practice "breast self-awareness." This means understanding the normal appearance and feel of your breasts and may include breast self-examination. Any changes detected, no matter how small, should be reported to a caregiver. Women in their 66s and 30s should have a clinical breast exam (CBE) by a caregiver as part of a regular health exam every 1 to 3 years. After age 101, women should have a CBE every year. Starting at age 100, women should consider having a mammogram (breast X-ray) every year. Women who have a family history of breast cancer should talk to their caregiver about genetic screening. Women at a high risk of breast cancer should talk to their caregiver about having an MRI and a mammogram every year.  Breast cancer gene (BRCA)-related cancer risk assessment is recommended for women who have family members with BRCA-related cancers. BRCA-related cancers include breast, ovarian, tubal, and peritoneal cancers. Having family members with these cancers may be associated with an increased risk for harmful changes (mutations) in the breast cancer genes BRCA1 and BRCA2. Results of the assessment will determine the need for genetic counseling and BRCA1 and BRCA2 testing.  The Pap test is a screening test for cervical cancer. Women should have a Pap test starting at age 57. Between ages 25 and 35, Pap tests should be repeated every 2 years. Beginning at age 37, you should have a Pap test every 3 years as long as the past 3 Pap tests have been normal. If you had a hysterectomy for a problem that was not cancer or a condition that could lead to cancer, then you no longer need Pap tests. If you are  between ages 50 and 76, and you have had normal Pap tests going back 10 years, you no longer need Pap tests. If you have had past treatment for cervical cancer or a condition that could lead to cancer, you need Pap tests and screening for cancer for at least 20 years after your treatment. If Pap tests have been discontinued, risk factors (such as a new sexual partner) need to be reassessed to determine if screening should be resumed. Some women have medical problems that increase the chance of getting cervical cancer. In these cases, your caregiver may recommend more frequent screening and Pap tests.  The human papillomavirus (HPV) test is an additional test that may be used for cervical cancer screening. The HPV test looks for the virus that can cause the cell changes on the cervix. The cells collected during the Pap test can be tested for HPV. The HPV test could be used to screen women aged 44 years and older, and should be used in women of any age  who have unclear Pap test results. After the age of 55, women should have HPV testing at the same frequency as a Pap test.  Colorectal cancer can be detected and often prevented. Most routine colorectal cancer screening begins at the age of 44 and continues through age 20. However, your caregiver may recommend screening at an earlier age if you have risk factors for colon cancer. On a yearly basis, your caregiver may provide home test kits to check for hidden blood in the stool. Use of a small camera at the end of a tube, to directly examine the colon (sigmoidoscopy or colonoscopy), can detect the earliest forms of colorectal cancer. Talk to your caregiver about this at age 86, when routine screening begins. Direct examination of the colon should be repeated every 5 to 10 years through age 13, unless early forms of pre-cancerous polyps or small growths are found.  Hepatitis C blood testing is recommended for all people born from [birth year] through 1963 and any  individual with known risks for hepatitis C.  Practice safe sex. Use condoms and avoid high-risk sexual practices to reduce the spread of sexually transmitted infections (STIs). Sexually active women aged 36 and younger should be checked for Chlamydia, which is a common sexually transmitted infection. Older women with new or multiple partners should also be tested for Chlamydia. Testing for other STIs is recommended if you are sexually active and at increased risk.  Osteoporosis is a disease in which the bones lose minerals and strength with aging. This can result in serious bone fractures. The risk of osteoporosis can be identified using a bone density scan. Women ages 20 and over and women at risk for fractures or osteoporosis should discuss screening with their caregivers. Ask your caregiver whether you should be taking a calcium supplement or vitamin D to reduce the rate of osteoporosis.  Menopause can be associated with physical symptoms and risks. Hormone replacement therapy is available to decrease symptoms and risks. You should talk to your caregiver about whether hormone replacement therapy is right for you.  Use sunscreen. Apply sunscreen liberally and repeatedly throughout the day. You should seek shade when your shadow is shorter than you. Protect yourself by wearing long sleeves, pants, a wide-brimmed hat, and sunglasses year round, whenever you are outdoors.  Notify your caregiver of new moles or changes in moles, especially if there is a change in shape or color. Also notify your caregiver if a mole is larger than the size of a pencil eraser.  Stay current with your immunizations. Document Released: 06/11/2011 Document Revised: 03/23/2013 Document Reviewed: 06/11/2011 [facility] Patient Information 2014 [employer].

## 2014-08-13 NOTE — Progress Notes (Signed)
Tracy Bowen 1976/09/24 [id number]        38 y.o.  [id number] for annual exam.   Doing well without complaints.  Past medical history,surgical history, problem list, medications, allergies, family history and social history were all reviewed and documented as reviewed in the EPIC chart.  ROS:  12 system ROS performed with pertinent positives and negatives included in the history, assessment and plan.   Additional significant findings :  None   Exam: [name] Counsellor Vitals:   08/13/14 0850  BP: 120/76  Height: 5\' 10"  (1.778 m)  Weight: 280 lb (127.007 kg)   General appearance:  Normal affect, orientation and appearance. Skin: Grossly normal HEENT: Without gross lesions.  No cervical or supraclavicular adenopathy. Thyroid normal.  Lungs:  Clear without wheezing, rales or rhonchi Cardiac: RR, without RMG Abdominal:  Soft, nontender, without masses, guarding, rebound, organomegaly or hernia Breasts:  Examined lying and sitting without masses, retractions, discharge or axillary adenopathy. Pelvic:  Ext/BUS/vagina with menses flow.  Cervix normal. Pap/HPV done after cleaning the cervix  Uterus anteverted, normal size, shape and contour, midline and mobile nontender   Adnexa  Without masses or tenderness    Anus and perineum  Normal   Rectovaginal  Normal sphincter tone without palpated masses or tenderness.    Assessment/Plan:  38 y.o. [id number] female for annual exam with regular menses, tubal sterilization.   1. Pap smear 2012. Pap/HPV today. No history of significant abnormal Pap smears. Plan repeat at 3-5 year interval assuming this Pap smear is normal per current screening guidelines. 2. Mammography. Screening mammographic recommendations between 2 and 40 reviewed. No strong family history and the patient prefers to wait closer to 73. SBE monthly reviewed. 3. History of HSV. Uses Valtrex twice daily for 5 days without breaks. Will call if needs refilled supply. 4. Health  maintenance. Baseline CBC comprehensive metabolic panel lipid profile ordered. Followup in one year, sooner as needed.   Note: This document was prepared with digital dictation and possible smart phrase technology. Any transcriptional errors that result from this process are unintentional.   Anastasio Auerbach MD, 9:17 AM 08/13/2014

## 2014-08-13 NOTE — Addendum Note (Signed)
Addended by: Nelva Nay on: 08/13/2014 09:48 AM   Modules accepted: Orders

## 2014-08-14 LAB — LIPID PANEL
CHOL/HDL RATIO: 3.3 ratio
Cholesterol: 179 mg/dL (ref 0–200)
HDL: 55 mg/dL (ref >39)
LDL CALC: 100 mg/dL — AB (ref 0–99)
Triglycerides: 121 mg/dL (ref <150)
VLDL: 24 mg/dL (ref 0–40)

## 2014-08-14 LAB — COMPREHENSIVE METABOLIC PANEL
ALK PHOS: 91 U/L (ref 39–117)
ALT: 12 U/L (ref 0–35)
AST: 16 U/L (ref 0–37)
Albumin: 3.8 g/dL (ref 3.5–5.2)
BUN: 8 mg/dL (ref 6–23)
CALCIUM: 9 mg/dL (ref 8.4–10.5)
CHLORIDE: 105 meq/L (ref 96–112)
CO2: 25 mEq/L (ref 19–32)
CREATININE: 0.94 mg/dL (ref 0.50–1.10)
Glucose, Bld: 73 mg/dL (ref 70–99)
POTASSIUM: 3.6 meq/L (ref 3.5–5.3)
Sodium: 139 mEq/L (ref 135–145)
Total Bilirubin: 0.3 mg/dL (ref 0.2–1.2)
Total Protein: 7 g/dL (ref 6.0–8.3)

## 2014-08-17 LAB — CYTOLOGY - PAP

## 2014-10-11 ENCOUNTER — Encounter: Payer: Self-pay | Admitting: Gynecology

## 2014-12-06 ENCOUNTER — Other Ambulatory Visit: Payer: Self-pay | Admitting: Gynecology

## 2014-12-08 ENCOUNTER — Other Ambulatory Visit: Payer: Self-pay | Admitting: Gynecology

## 2014-12-09 ENCOUNTER — Telehealth: Payer: Self-pay

## 2014-12-09 MED ORDER — METRONIDAZOLE 500 MG PO TABS: ORAL_TABLET | ORAL | Status: DC

## 2014-12-09 NOTE — Telephone Encounter (Signed)
Options are Flagyl 500 mg twice a day 7 days but should not drink alcohol at all. Alternative is Cleocin vaginal cream nightly 7 days. Can drink alcohol with this. Her option.

## 2014-12-09 NOTE — Telephone Encounter (Signed)
Patient informed and options discussed. She said she would not be consuming alcohol and understands to avoid it with Metronidazole. She wants that Rx. Rx sent and phoned in as instructions were lengthier that usual and I was concerned it may not go electronically.

## 2014-12-09 NOTE — Telephone Encounter (Signed)
Patient said she is out of town until later next week but is fairly certain she has another bacterial infection and asked if you would call in Rx.  C/o thin, watery discharge with slight odor and vaginal irritation. She said she has had before after menses and feels it is same thing.

## 2014-12-13 ENCOUNTER — Encounter (HOSPITAL_COMMUNITY): Payer: Self-pay | Admitting: *Deleted

## 2014-12-13 ENCOUNTER — Inpatient Hospital Stay (HOSPITAL_COMMUNITY)
Admission: EM | Admit: 2014-12-13 | Discharge: 2014-12-17 | DRG: 603 | Disposition: A | Discharge status: Home / Self Care | Payer: 59 | Attending: Internal Medicine | Admitting: Internal Medicine

## 2014-12-13 DIAGNOSIS — K219 Gastro-esophageal reflux disease without esophagitis: Secondary | ICD-10-CM | POA: Diagnosis present

## 2014-12-13 DIAGNOSIS — T783XXA Angioneurotic edema, initial encounter: Secondary | ICD-10-CM | POA: Diagnosis present

## 2014-12-13 DIAGNOSIS — L03211 Cellulitis of face: Principal | ICD-10-CM | POA: Diagnosis present

## 2014-12-13 DIAGNOSIS — T782XXA Anaphylactic shock, unspecified, initial encounter: Secondary | ICD-10-CM | POA: Diagnosis present

## 2014-12-13 DIAGNOSIS — Z9049 Acquired absence of other specified parts of digestive tract: Secondary | ICD-10-CM | POA: Diagnosis present

## 2014-12-13 DIAGNOSIS — Z91048 Other nonmedicinal substance allergy status: Secondary | ICD-10-CM

## 2014-12-13 DIAGNOSIS — T7840XD Allergy, unspecified, subsequent encounter: Secondary | ICD-10-CM

## 2014-12-13 DIAGNOSIS — R739 Hyperglycemia, unspecified: Secondary | ICD-10-CM | POA: Diagnosis present

## 2014-12-13 DIAGNOSIS — D649 Anemia, unspecified: Secondary | ICD-10-CM | POA: Diagnosis present

## 2014-12-13 DIAGNOSIS — T380X5A Adverse effect of glucocorticoids and synthetic analogues, initial encounter: Secondary | ICD-10-CM | POA: Diagnosis present

## 2014-12-13 DIAGNOSIS — L0201 Cutaneous abscess of face: Secondary | ICD-10-CM

## 2014-12-13 DIAGNOSIS — T7840XA Allergy, unspecified, initial encounter: Secondary | ICD-10-CM | POA: Diagnosis present

## 2014-12-13 DIAGNOSIS — F419 Anxiety disorder, unspecified: Secondary | ICD-10-CM | POA: Diagnosis present

## 2014-12-13 DIAGNOSIS — R220 Localized swelling, mass and lump, head: Secondary | ICD-10-CM | POA: Diagnosis not present

## 2014-12-13 DIAGNOSIS — F329 Major depressive disorder, single episode, unspecified: Secondary | ICD-10-CM | POA: Diagnosis present

## 2014-12-13 DIAGNOSIS — L0390 Cellulitis, unspecified: Secondary | ICD-10-CM | POA: Diagnosis present

## 2014-12-13 LAB — COMPREHENSIVE METABOLIC PANEL
ALBUMIN: 4 g/dL (ref 3.5–5.2)
ALT: 13 U/L (ref 0–35)
ANION GAP: 6 (ref 5–15)
AST: 23 U/L (ref 0–37)
Alkaline Phosphatase: 95 U/L (ref 39–117)
BUN: 6 mg/dL (ref 6–23)
CHLORIDE: 108 meq/L (ref 96–112)
CO2: 21 mmol/L (ref 19–32)
CREATININE: 0.97 mg/dL (ref 0.50–1.10)
Calcium: 8.7 mg/dL (ref 8.4–10.5)
GFR calc Af Amer: 86 mL/min — ABNORMAL LOW (ref >90)
GFR calc non Af Amer: 74 mL/min — ABNORMAL LOW (ref >90)
Glucose, Bld: 174 mg/dL — ABNORMAL HIGH (ref 70–99)
POTASSIUM: 3.6 mmol/L (ref 3.5–5.1)
Sodium: 135 mmol/L (ref 135–145)
Total Bilirubin: 0.2 mg/dL — ABNORMAL LOW (ref 0.3–1.2)
Total Protein: 7.9 g/dL (ref 6.0–8.3)

## 2014-12-13 LAB — CBC
HCT: 35.6 % — ABNORMAL LOW (ref 36.0–46.0)
HEMOGLOBIN: 11.3 g/dL — AB (ref 12.0–15.0)
MCH: 27.2 pg (ref 26.0–34.0)
MCHC: 31.7 g/dL (ref 30.0–36.0)
MCV: 85.8 fL (ref 78.0–100.0)
Platelets: 225 10*3/uL (ref 150–400)
RBC: 4.15 MIL/uL (ref 3.87–5.11)
RDW: 14.5 % (ref 11.5–15.5)
WBC: 7.3 10*3/uL (ref 4.0–10.5)

## 2014-12-13 LAB — HIV ANTIBODY (ROUTINE TESTING W REFLEX): HIV 1&2 Ab, 4th Generation: NONREACTIVE

## 2014-12-13 MED ORDER — RANITIDINE HCL 150 MG/10ML PO SYRP
300.0000 mg | ORAL_SOLUTION | Freq: Once | ORAL | Status: AC
  Administered 2014-12-13: 300 mg via ORAL
  Filled 2014-12-13: qty 20

## 2014-12-13 MED ORDER — DIPHENHYDRAMINE HCL 50 MG/ML IJ SOLN
25.0000 mg | Freq: Four times a day (QID) | INTRAMUSCULAR | Status: DC | PRN
  Administered 2014-12-13 – 2014-12-17 (×10): 25 mg via INTRAVENOUS
  Filled 2014-12-13 (×12): qty 1

## 2014-12-13 MED ORDER — SODIUM CHLORIDE 0.9 % IJ SOLN
3.0000 mL | Freq: Two times a day (BID) | INTRAMUSCULAR | Status: DC
  Administered 2014-12-13 – 2014-12-17 (×9): 3 mL via INTRAVENOUS

## 2014-12-13 MED ORDER — PREDNISONE 20 MG PO TABS
60.0000 mg | ORAL_TABLET | Freq: Once | ORAL | Status: AC
  Administered 2014-12-13: 60 mg via ORAL
  Filled 2014-12-13: qty 3

## 2014-12-13 MED ORDER — OXYCODONE HCL 5 MG PO TABS
5.0000 mg | ORAL_TABLET | Freq: Four times a day (QID) | ORAL | Status: DC | PRN
  Administered 2014-12-13 – 2014-12-17 (×8): 5 mg via ORAL
  Filled 2014-12-13 (×8): qty 1

## 2014-12-13 MED ORDER — PREDNISONE 20 MG PO TABS
60.0000 mg | ORAL_TABLET | Freq: Every day | ORAL | Status: DC
Start: 2014-12-13 — End: 2015-08-06

## 2014-12-13 MED ORDER — SODIUM CHLORIDE 0.9 % IJ SOLN: 3.0000 mL | INTRAMUSCULAR | Status: DC | PRN

## 2014-12-13 MED ORDER — ACETAMINOPHEN 650 MG RE SUPP: 650.0000 mg | Freq: Four times a day (QID) | RECTAL | Status: DC | PRN

## 2014-12-13 MED ORDER — PREDNISONE 20 MG PO TABS
40.0000 mg | ORAL_TABLET | Freq: Every day | ORAL | Status: DC
  Filled 2014-12-13: qty 2

## 2014-12-13 MED ORDER — ALUM & MAG HYDROXIDE-SIMETH 200-200-20 MG/5ML PO SUSP
30.0000 mL | Freq: Four times a day (QID) | ORAL | Status: DC | PRN
Start: 2014-12-13 — End: 2014-12-17

## 2014-12-13 MED ORDER — CEPHALEXIN 500 MG PO CAPS
500.0000 mg | ORAL_CAPSULE | Freq: Four times a day (QID) | ORAL | Status: DC
  Administered 2014-12-13 – 2014-12-15 (×8): 500 mg via ORAL
  Filled 2014-12-13 (×12): qty 1

## 2014-12-13 MED ORDER — FAMOTIDINE 20 MG PO TABS
20.0000 mg | ORAL_TABLET | Freq: Two times a day (BID) | ORAL | Status: DC
  Administered 2014-12-13 – 2014-12-17 (×9): 20 mg via ORAL
  Filled 2014-12-13 (×10): qty 1

## 2014-12-13 MED ORDER — EPINEPHRINE 0.3 MG/0.3ML IJ SOAJ: 0.3000 mg | Freq: Once | INTRAMUSCULAR | Status: DC

## 2014-12-13 MED ORDER — LORATADINE 10 MG PO TABS
10.0000 mg | ORAL_TABLET | Freq: Every day | ORAL | Status: DC
  Administered 2014-12-13 – 2014-12-17 (×5): 10 mg via ORAL
  Filled 2014-12-13 (×5): qty 1

## 2014-12-13 MED ORDER — ACETAMINOPHEN 325 MG PO TABS
650.0000 mg | ORAL_TABLET | Freq: Four times a day (QID) | ORAL | Status: DC | PRN
  Administered 2014-12-13: 650 mg via ORAL
  Filled 2014-12-13: qty 2

## 2014-12-13 MED ORDER — DIPHENHYDRAMINE HCL 25 MG PO CAPS
50.0000 mg | ORAL_CAPSULE | Freq: Once | ORAL | Status: AC
  Administered 2014-12-13: 50 mg via ORAL
  Filled 2014-12-13: qty 2

## 2014-12-13 MED ORDER — SODIUM CHLORIDE 0.9 % IV SOLN: 250.0000 mL | INTRAVENOUS | Status: DC | PRN

## 2014-12-13 MED ORDER — EPINEPHRINE 0.3 MG/0.3ML IJ SOAJ
0.3000 mg | Freq: Once | INTRAMUSCULAR | Status: AC
  Administered 2014-12-13: 0.3 mg via INTRAMUSCULAR
  Filled 2014-12-13: qty 0.3

## 2014-12-13 MED ORDER — PROMETHAZINE HCL 25 MG PO TABS: 12.5000 mg | ORAL_TABLET | Freq: Four times a day (QID) | ORAL | Status: DC | PRN

## 2014-12-13 NOTE — ED Provider Notes (Signed)
CSN: [id number]     Arrival date & time 12/13/14  0357 History   First MD Initiated Contact with Patient 12/13/14 [phone]     Chief Complaint  Patient presents with  . Allergic Reaction     (Consider location/radiation/quality/duration/timing/severity/associated sxs/prior Treatment) HPI  Tracy Bowen is a 39 y.o. female with past medical history of herpes, depression, anxiety presenting today with an acute allergic reaction. Patient recently had her hair done on Saturday, relaxer was put in it. She believes that a new relaxer was used and she has an allergic reaction to this. She complains of diffuse swelling over her face, especially around both eyes. Does feel a "different" sensation in her throat, but denies his throat closing. She normally has highs and diffuse pruritus, she denies this being any worse. She denies any shortness of breath, nausea or vomiting. She also states the area of the relaxer was placed is now weeping. Patient has no further complaints.  10 Systems reviewed and are negative for acute change except as noted in the HPI.      Past Medical History  Diagnosis Date  . HSV-2 infection   . Seasonal allergies   . Mild acid reflux WATCHES DIET  . Depression   . Anxiety   . Leiomyoma 09/2012   Past Surgical History  Procedure Laterality Date  . Knee arthroscopy w/ acl reconstruction and patella graft  02-15-2000    RIGHT KNEE  W/ POST DRAIN REMOVAL 02-16-2000  . Laparoscopic cholecystectomy  10-23-2007  . Dilation and curettage of uterus  1997    W/ SUCTION  . Induced abortion    . Tubal ligation  10/03/2012    Procedure: BILATERAL TUBAL LIGATION;  Surgeon: Anastasio Auerbach, MD;  Location: [facility];  Service: Gynecology;  Laterality: Bilateral;  Laparoscopic Tubal Ligation with Fallope Rings.  2nd choice is 10/10/12 at 7:30am.   Family History  Problem Relation Age of Onset  . Hypertension Mother   . Cancer Maternal Grandmother     LUNG  CANCER  . Diabetes Paternal Grandmother    History  Substance Use Topics  . Smoking status: Never Smoker   . Smokeless tobacco: Never Used  . Alcohol Use: No   OB History    Gravida Para Term Preterm AB TAB SAB Ectopic Multiple Living   3 1   2 2    1      Review of Systems    Allergies  Review of patient's allergies indicates no known allergies.  Home Medications   Prior to Admission medications   Medication Sig Start Date End Date Taking? Authorizing Provider  cetirizine (ZYRTEC) 10 MG tablet Take 10 mg by mouth daily.    Historical Provider, MD  metroNIDAZOLE (FLAGYL) 500 MG tablet TAKE 1 TABLET (500 MG TOTAL) BY MOUTH 2 (TWO) TIMES DAILY. FOR 7 DAYS. AVOID ALCOHOL WHILE TAKING 12/09/14   Anastasio Auerbach, MD  valACYclovir (VALTREX) 500 MG tablet TAKE 1 TABLET (500 MG TOTAL) BY MOUTH 2 (TWO) TIMES DAILY. FOR 5 DAYS WITH OUTBREAK 12/18/13   Anastasio Auerbach, MD   There were no vitals taken for this visit. Physical Exam  Constitutional: She is oriented to person, place, and time. She appears well-developed and well-nourished. No distress.  HENT:  Head: Normocephalic and atraumatic.  Nose: Nose normal.  Mouth/Throat: Oropharynx is clear and moist. No oropharyngeal exudate.  No oral swelling seen. Patient has diffuse facial swelling around her forehead, bilateral temples, bilateral eyes.  Eyes:  Conjunctivae and EOM are normal. Pupils are equal, round, and reactive to light. No scleral icterus.  Neck: Normal range of motion. Neck supple. No JVD present. No tracheal deviation present. No thyromegaly present.  Cardiovascular: Normal rate, regular rhythm and normal heart sounds.  Exam reveals no gallop and no friction rub.   No murmur heard. Pulmonary/Chest: Effort normal and breath sounds normal. No respiratory distress. She has no wheezes. She exhibits no tenderness.  Abdominal: Soft. Bowel sounds are normal. She exhibits no distension and no mass. There is no tenderness.  There is no rebound and no guarding.  Musculoskeletal: Normal range of motion. She exhibits no edema or tenderness.  Lymphadenopathy:    She has no cervical adenopathy.  Neurological: She is alert and oriented to person, place, and time. No cranial nerve deficit. She exhibits normal muscle tone.  Skin: Skin is warm and dry. No rash noted. She is not diaphoretic. No erythema. No pallor.  Nursing note and vitals reviewed.   ED Course  Procedures (including critical care time) Labs Review Labs Reviewed - No data to display  Imaging Review No results found.   EKG Interpretation None      MDM   Final diagnoses:  None    Patient presents emergency department for an acute allergic reaction. Swelling is localized to her face close to the area of the relaxer was placed. This is likely the cause of her symptoms. She has no other systemic findings. She does have pruritus but she states that her baseline. She also states she has welts at her baseline and I did not see any on physical exam. There is no wheezing. She still refused with a Motrin in her throat felt funny. This may be signs of airway involvement. I see nothing on physical exam. Patient was given epinephrine, as well as steroids and histamine blockers. Will watch for 3 hours, if she improves she will be safe for discharge.  Upon repeat evaluation, patient continues to have significant swelling of her face when compared to prior pictures in her possession.  She also states she has so irritation of her throat.  She will need to remain in the hospital for observation to ensure this does not worsen.  She was admitted to triad, med surg unit.  CRITICAL CARE Performed by: Everlene Balls   Total critical care time: 58min. - epi for anaphylaxis  Critical care time was exclusive of separately billable procedures and treating other patients.  Critical care was necessary to treat or prevent imminent or life-threatening  deterioration.  Critical care was time spent personally by me on the following activities: development of treatment plan with patient and/or surrogate as well as nursing, discussions with consultants, evaluation of patient's response to treatment, examination of patient, obtaining history from patient or surrogate, ordering and performing treatments and interventions, ordering and review of laboratory studies, ordering and review of radiographic studies, pulse oximetry and re-evaluation of patient's condition.     Everlene Balls, MD 12/13/14 [phone]

## 2014-12-13 NOTE — ED Notes (Signed)
Dr. Claudine Mouton at bedside to speak with patient EDP awaiting return call of hospitalist

## 2014-12-13 NOTE — ED Notes (Signed)
Patient up to bathroom before triage could begin Patient in NAD

## 2014-12-13 NOTE — ED Notes (Signed)
Patient states that she feels as if the left side of her face/eye is still swollen Patient has removed herself from the cardiac leads and states that she wants to go home Consult noted Per EDP, patient to be admitted Will have Dr. Claudine Mouton speak with patient

## 2014-12-13 NOTE — Discharge Instructions (Signed)
Anaphylactic Reaction ° °Miss Scrivner, you were seen today for an allergic reaction. Continue to take steroids as prescribed and follow-up with a primary physician within 3 days. If this occurs again use the EpiPen immediately and come back to the emergency department immediately. Thank you. °An anaphylactic reaction is a sudden, severe allergic reaction. It affects the whole body. It can be life threatening. You may need to stay in the hospital.  °HOME CARE °· Wear a medical bracelet or necklace that lists your allergy. °· Carry your allergy kit or medicine shot to treat severe allergic reactions with you. These can save your life. °· Do not drive until medicine from your shot has worn off, unless your doctor says it is okay. °· If you have hives or a rash: °¨ Take medicine as told by your doctor. °¨ You may take over-the-counter antihistamine medicine. °¨ Place cold cloths on your skin. Take baths in cool water. Avoid hot baths and hot showers. °GET HELP RIGHT AWAY IF:  °· Your mouth is puffy (swollen), or you have trouble breathing. °· You start making whistling sounds when you breathe (wheezing). °· You have a tight feeling in your chest or throat. °· You have a rash, hives, puffiness, or itching on your body. °· You throw up (vomit) or have watery poop (diarrhea). °· You feel dizzy or pass out (faint). °· You think you are having an allergic reaction. °· You have new symptoms. °This is an emergency. Use your medicine shot or allergy kit as told. Call your local emergency services (911 in [place] Even if you feel better after the shot, you need to go to the hospital emergency department. °MAKE SURE YOU:  °· Understand these instructions. °· Will watch your condition. °· Will get help right away if you are not doing well or get worse. °Document Released: 05/14/2008 Document Revised: 05/27/2012 Document Reviewed: 02/27/2012 °[employer]® Patient Information ©2015 [employer]. This information is not intended to  replace advice given to you by your health care provider. Make sure you discuss any questions you have with your health care provider. ° °

## 2014-12-13 NOTE — H&P (Signed)
History and Physical:    CIERRIA HEIGHT HYI:[id number] DOB: [birth year] DOA: 12/13/2014  Referring physician: Dr. Claudine Mouton PCP: No Pcp Per Pt   Chief Complaint: Facial swelling  History of Present Illness:   ROCQUEL Bowen is an 39 y.o. female with a PMH of HSV-2, dermatitis, no prior history of furuncles/soft tissue infection, who had a hair treatment done with a 48 hour of left sided facial swelling.  She also had her eyebrows waxed a few days ago.  No frank fever or chills.  The left side of her face is tender, and she has noticed some weeping of the skin on her left hairline.  The patient has chronic allergies, and has chronic issues with pruritis and hives, but noted that her head got very pruritic.  She also had hives, but reports these resolved after being given Benadryl in the ED.  She had some difficulty swallowing an Excedrin tablet and then had difficulty swallowing prednisone in the ED, but did not notice any frank swelling to her lips or tongue.  No dypnea or cough.    ROS:   Constitutional: No fever, no chills;  Appetite normal; No weight loss, no weight gain, + fatigue.  HEENT: No blurry vision, no diplopia, no pharyngitis, no dysphagia CV: No chest pain, no palpitations, no PND, no orthopnea, no edema.  Resp: No SOB, no cough, no pleuritic pain. GI: No nausea, no vomiting, no diarrhea, no melena, no hematochezia, no constipation, no abdominal pain.  GU: No dysuria, no hematuria, no frequency, no urgency. MSK: no myalgias, no arthralgias.  Neuro:  + headache, no focal neurological deficits, no history of seizures.  Psych: No depression, no anxiety.  Endo: No heat intolerance, no cold intolerance, no polyuria, no polydipsia, LMP 12/12/14. Skin: + rashes, + skin lesions.  Heme: No easy bruising.  Travel history: No recent travel.   Past Medical History:   Past Medical History  Diagnosis Date  . HSV-2 infection   . Seasonal allergies   . Mild acid reflux WATCHES DIET  .  Depression   . Anxiety   . Leiomyoma 09/2012    Past Surgical History:   Past Surgical History  Procedure Laterality Date  . Knee arthroscopy w/ acl reconstruction and patella graft  02-15-2000    RIGHT KNEE  W/ POST DRAIN REMOVAL 02-16-2000  . Laparoscopic cholecystectomy  10-23-2007  . Dilation and curettage of uterus  1997    W/ SUCTION  . Induced abortion    . Tubal ligation  10/03/2012    Procedure: BILATERAL TUBAL LIGATION;  Surgeon: Anastasio Auerbach, MD;  Location: [facility];  Service: Gynecology;  Laterality: Bilateral;  Laparoscopic Tubal Ligation with Fallope Rings.  2nd choice is 10/10/12 at 7:30am.    Social History:   History   Social History  . Marital Status: Single    Spouse Name: N/A    Number of Children: 1  . Years of Education: N/A   Occupational History  . Student     Social History Main Topics  . Smoking status: Never Smoker   . Smokeless tobacco: Never Used  . Alcohol Use: No  . Drug Use: No  . Sexual Activity: Not Currently    Birth Control/ Protection: Surgical     Comment: Tubal lig   Other Topics Concern  . Not on file   Social History Narrative   Single.  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  Lives with a family friend.  Family history:   Family History  Problem Relation Age of Onset  . Hypertension Mother   . Cancer Maternal Grandmother     LUNG CANCER  . Diabetes Paternal Grandmother     Allergies   Review of patient's allergies indicates no known allergies.  Current Medications:   Prior to Admission medications   Medication Sig Start Date End Date Taking? Authorizing Provider  cetirizine (ZYRTEC) 10 MG tablet Take 10 mg by mouth daily.   Yes Historical Provider, MD  diphenhydrAMINE (BENADRYL) 25 MG tablet Take 25 mg by mouth every 6 (six) hours as needed for itching or allergies.   Yes Historical Provider, MD  EPINEPHrine (EPIPEN 2-PAK) 0.3 mg/0.3 mL IJ SOAJ injection Inject 0.3 mLs  (0.3 mg total) into the muscle once. 12/13/14   Everlene Balls, MD  metroNIDAZOLE (FLAGYL) 500 MG tablet TAKE 1 TABLET (500 MG TOTAL) BY MOUTH 2 (TWO) TIMES DAILY. FOR 7 DAYS. AVOID ALCOHOL WHILE TAKING Patient not taking: Reported on 12/13/2014 12/09/14   Anastasio Auerbach, MD  predniSONE (DELTASONE) 20 MG tablet Take 3 tablets (60 mg total) by mouth daily. 12/13/14   Everlene Balls, MD  valACYclovir (VALTREX) 500 MG tablet TAKE 1 TABLET (500 MG TOTAL) BY MOUTH 2 (TWO) TIMES DAILY. FOR 5 DAYS WITH OUTBREAK Patient not taking: Reported on 12/13/2014 12/18/13   Anastasio Auerbach, MD    Physical Exam:   Filed Vitals:   12/13/14 0730 12/13/14 0745 12/13/14 0838 12/13/14 1404  BP: 125/88 123/83 132/78 101/77  Pulse: 94 94 94 100  Temp:   98.1 F (36.7 C) 98.6 F (37 C)  TempSrc:   Oral Oral  Resp:   20 18  SpO2: 97% 100% 99% 99%     Physical Exam: Blood pressure 101/77, pulse 100, temperature 98.6 F (37 C), temperature source Oral, resp. rate 18, last menstrual period 12/12/2014, SpO2 99 %. Gen: No acute distress. Head: Normocephalic, atraumatic. Eyes: PERRL, EOMI, sclerae nonicteric. Mouth: Oropharynx clear without any evidence of tongue swelling, uvular swelling, or swelling about the lips. Neck: Supple, no thyromegaly, no lymphadenopathy, no jugular venous distention. Chest: Lungs are clear to auscultation bilaterally. CV: Heart sounds are regular, with no murmurs, rubs, or gallops. Abdomen: Soft, nontender, nondistended with normal active bowel sounds. Extremities: Extremities are without clubbing, edema, or cyanosis. Skin: Warm and dry. No hives present. Face is pictured below. Neuro: Alert and oriented times 3; cranial nerves II through XII grossly intact. Psych: Mood and affect normal.   Left face/hairline:     Data Review:    Labs: Basic Metabolic Panel:  Recent Labs Lab 12/13/14 1006  NA 135  K 3.6  CL 108  CO2 21  GLUCOSE 174*  BUN 6  CREATININE 0.97  CALCIUM 8.7    Liver Function Tests:  Recent Labs Lab 12/13/14 1006  AST 23  ALT 13  ALKPHOS 95  BILITOT 0.2*  PROT 7.9  ALBUMIN 4.0   CBC:  Recent Labs Lab 12/13/14 1006  WBC 7.3  HGB 11.3*  HCT 35.6*  MCV 85.8  PLT 225    Radiographic Studies: No results found.    Assessment/Plan:   Principal Problem:   Cellulitis of face +/- allergic reaction  Although the EDP felt she had angioedema and anaphylaxis, I can find no convincing evidence of such. Her facial swelling is asymmetric, which I would not expect if she had an allergic reaction to a hair product that was applied bilaterally.  She received 60 mg of  prednisone, as well as epinephrine on admission, but her blood pressure is stable and she does not currently have any rash/hives.  Given pustular eruption on left hairline, a history of recent eyebrow waxing, and clinical suspicion of cellulitis, will place on empiric Keflex. If there is no improvement in the swelling 12/14/14, would workup further with a CT to rule out periorbital cellulitis.  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 Would discontinue prednisone.  Would refer her to an allergist at discharge.  Follow-up HIV serologies per cellulitis order set.  Active Problems:   Steroid-induced hyperglycemia  Check hemoglobin A1c. Elevated glucose likely simply from being given steroids.    Normocytic anemia  Likely from menstrual losses. No further workup needed.    DVT prophylaxis  Low risk, ambulatory status. Initiate prophylaxis if not discharged tomorrow.  Code Status: Full. Family Communication: No family present.  [place] [phone] is emergency contact. Disposition Plan: Home when stable.  Time spent: 55 minutes.  Montoya Watkin [facility] Pager [phone] Cell: [phone]   If 7PM-7AM, please contact night-coverage [email or web address] Password [email or web address] 12/13/2014,  4:06 PM

## 2014-12-13 NOTE — ED Notes (Signed)
Patient had relaxer put in on Saturday Patient with c/o swelling to the face Patient able to speak in full, complete sentences without difficulty--handles secretions  RR WNL--even and unlabored with equal rise and fall of chest Patient in NAD

## 2014-12-14 DIAGNOSIS — T7840XS Allergy, unspecified, sequela: Secondary | ICD-10-CM

## 2014-12-14 LAB — HEMOGLOBIN A1C
Hgb A1c MFr Bld: 5.9 % — ABNORMAL HIGH (ref <5.7)
Mean Plasma Glucose: 123 mg/dL — ABNORMAL HIGH (ref <117)

## 2014-12-14 LAB — GLUCOSE, CAPILLARY
GLUCOSE-CAPILLARY: 89 mg/dL (ref 70–99)
Glucose-Capillary: 111 mg/dL — ABNORMAL HIGH (ref 70–99)
Glucose-Capillary: 118 mg/dL — ABNORMAL HIGH (ref 70–99)

## 2014-12-14 MED ORDER — INSULIN ASPART 100 UNIT/ML ~~LOC~~ SOLN: 0.0000 [IU] | Freq: Three times a day (TID) | SUBCUTANEOUS | Status: DC

## 2014-12-14 MED ORDER — METHYLPREDNISOLONE SODIUM SUCC 125 MG IJ SOLR
60.0000 mg | INTRAMUSCULAR | Status: DC
  Administered 2014-12-14 – 2014-12-15 (×2): 60 mg via INTRAVENOUS
  Filled 2014-12-14 (×2): qty 0.96

## 2014-12-14 NOTE — Progress Notes (Signed)
UR completed 

## 2014-12-14 NOTE — Progress Notes (Signed)
Patient ID: Tracy Bowen, female   DOB: [birth year], 39 y.o.   MRN: [id number] [facility] PROGRESS NOTE  Tracy Bowen WUJ:[id number] DOB: May 20, 1976 DOA: 12/13/2014 PCP: No Pcp Per Pt  Brief narrative:    39 y.o. female with a PMH of HSV-2, dermatitis, no prior history of furuncles/soft tissue infection who had a hair treatment done with a 48 hour of left sided facial swelling. She also had her eyebrows waxed a few days PTA. No frank fever or chills. The left side of her face was tender and she has noticed some weeping of the skin on her left hairline. The patient has chronic allergies, and has chronic issues with pruritis and hives and has noted that her head got very pruritic. She also had hives, but reports these resolved after being given Benadryl in the ED. She had some difficulty swallowing an Excedrin tablet and then had difficulty swallowing prednisone in the ED. patient did not have swollen lips were swollen tongue.  Assessment/Plan:    Principal Problem:  Cellulitis of face +/- allergic reaction  Facial swelling seems to be low worse this morning. She reports less tenderness at the site but swelling and now extends towards the left eyelid.  Patient is currently on cephalexin 500 mg by mouth every 6 hours, Benadryl as needed, Pepcid.  We will start Solu-Medrol 60 mg IV and see if this improves swelling. Because we started steroids we will add sliding scale insulin for possible hyperglycemia.  No difficulty swallowing.  Active Problems:  Steroid-induced hyperglycemia  Hemoglobin A1c is 5.9 on this admission.  Added sliding scale insulin.   Normocytic anemia  Likely from menstrual losses. No further workup needed.   DVT prophylaxis  SCDs for DVT prophylaxis.   Code Status: Full.  Family Communication:  plan of care discussed with the patient Disposition Plan: Home when stable.   IV access:  Peripheral IV  Procedures and diagnostic studies:     No results found.  Medical Consultants:  None  Other Consultants:  None  IAnti-Infectives:   Cephalexin 12/13/2014 -->   Leisa Lenz, MD  [facility] Pager [phone]  If 7PM-7AM, please contact night-coverage [email or web address] Password [email or web address] 12/14/2014, 10:25 AM   LOS: 1 day    HPI/Subjective: No acute overnight events.  Objective: Filed Vitals:   12/13/14 1404 12/13/14 2108 12/14/14 0520 12/14/14 0751  BP: 101/77 119/77 124/76   Pulse: 100 107 80   Temp: 98.6 F (37 C) 98.6 F (37 C) 98.2 F (36.8 C)   TempSrc: Oral Oral Oral   Resp: 18 20 18    Height:    5\' 11"  (1.803 m)  Weight:    129.275 kg (285 lb)  SpO2: 99% 100% 100%    No intake or output data in the 24 hours ending 12/14/14 1025  Exam:   General:  Pt is alert, follows commands appropriately, more facial swelling noted this morning especially now over left eyelid. No vision loss.  Cardiovascular: Regular rate and rhythm, S1/S2, no murmurs  Respiratory: Clear to auscultation bilaterally, no wheezing, no crackles, no rhonchi  Abdomen: Soft, non tender, non distended, bowel sounds present  Extremities: No edema, pulses DP and PT palpable bilaterally  Neuro: Grossly nonfocal  Data Reviewed: Basic Metabolic Panel:  Recent Labs Lab 12/13/14 1006  NA 135  K 3.6  CL 108  CO2 21  GLUCOSE 174*  BUN 6  CREATININE 0.97  CALCIUM 8.7   Liver Function Tests:  Recent Labs Lab 12/13/14 1006  AST 23  ALT 13  ALKPHOS 95  BILITOT 0.2*  PROT 7.9  ALBUMIN 4.0   No results for input(s): LIPASE, AMYLASE in the last 168 hours. No results for input(s): AMMONIA in the last 168 hours. CBC:  Recent Labs Lab 12/13/14 1006  WBC 7.3  HGB 11.3*  HCT 35.6*  MCV 85.8  PLT 225   Cardiac Enzymes: No results for input(s): CKTOTAL, CKMB, CKMBINDEX, TROPONINI in the last 168 hours. BNP: Invalid input(s): POCBNP CBG: No results for input(s): GLUCAP in the last 168 hours.  No results found  for this or any previous visit (from the past 240 hour(s)).   Scheduled Meds: . cephALEXin  500 mg Oral 4 times per day  . famotidine  20 mg Oral BID  . loratadine  10 mg Oral Daily  . methylPREDNISolone (SOLU-MEDROL) injection  60 mg Intravenous Q24H  . sodium chloride  3 mL Intravenous Q12H   Continuous Infusions:

## 2014-12-15 DIAGNOSIS — R739 Hyperglycemia, unspecified: Secondary | ICD-10-CM | POA: Diagnosis present

## 2014-12-15 DIAGNOSIS — F419 Anxiety disorder, unspecified: Secondary | ICD-10-CM | POA: Diagnosis present

## 2014-12-15 DIAGNOSIS — L0390 Cellulitis, unspecified: Secondary | ICD-10-CM | POA: Diagnosis present

## 2014-12-15 DIAGNOSIS — T380X5A Adverse effect of glucocorticoids and synthetic analogues, initial encounter: Secondary | ICD-10-CM | POA: Diagnosis present

## 2014-12-15 DIAGNOSIS — R220 Localized swelling, mass and lump, head: Secondary | ICD-10-CM | POA: Diagnosis present

## 2014-12-15 DIAGNOSIS — F329 Major depressive disorder, single episode, unspecified: Secondary | ICD-10-CM | POA: Diagnosis present

## 2014-12-15 DIAGNOSIS — Z91048 Other nonmedicinal substance allergy status: Secondary | ICD-10-CM | POA: Diagnosis not present

## 2014-12-15 DIAGNOSIS — Z9049 Acquired absence of other specified parts of digestive tract: Secondary | ICD-10-CM | POA: Diagnosis present

## 2014-12-15 DIAGNOSIS — L0201 Cutaneous abscess of face: Secondary | ICD-10-CM | POA: Diagnosis not present

## 2014-12-15 DIAGNOSIS — L03211 Cellulitis of face: Secondary | ICD-10-CM | POA: Diagnosis not present

## 2014-12-15 DIAGNOSIS — D649 Anemia, unspecified: Secondary | ICD-10-CM | POA: Diagnosis not present

## 2014-12-15 DIAGNOSIS — K219 Gastro-esophageal reflux disease without esophagitis: Secondary | ICD-10-CM | POA: Diagnosis present

## 2014-12-15 LAB — GLUCOSE, CAPILLARY
GLUCOSE-CAPILLARY: 72 mg/dL (ref 70–99)
Glucose-Capillary: 92 mg/dL (ref 70–99)

## 2014-12-15 MED ORDER — LIP MEDEX EX OINT
TOPICAL_OINTMENT | CUTANEOUS | Status: AC
  Administered 2014-12-15: 16:00:00
  Filled 2014-12-15: qty 7

## 2014-12-15 MED ORDER — DOXYCYCLINE HYCLATE 100 MG IV SOLR
100.0000 mg | Freq: Two times a day (BID) | INTRAVENOUS | Status: DC
  Administered 2014-12-15 – 2014-12-17 (×4): 100 mg via INTRAVENOUS
  Filled 2014-12-15 (×5): qty 100

## 2014-12-15 MED ORDER — MUPIROCIN 2 % EX OINT
TOPICAL_OINTMENT | Freq: Two times a day (BID) | CUTANEOUS | Status: DC
  Administered 2014-12-15 – 2014-12-17 (×4): via TOPICAL
  Filled 2014-12-15 (×2): qty 22

## 2014-12-15 NOTE — Progress Notes (Signed)
UR completed 

## 2014-12-15 NOTE — Progress Notes (Signed)
[facility] PROGRESS NOTE    Assessment/Plan: Cellulitis of face - change antibiotics to doxy. Add mupirocin.  Steroid-induced hyperglycemia - cont SSI.  Normocytic anemia - start ferrous sulfate.    Code Status: full Family Communication: none  Disposition Plan: inpatient   Consultants:  none  Procedures:  none  Antibiotics:  Keflex  Doxy  HPI/Subjective: haed still hurts.  Objective: Filed Vitals:   12/14/14 0520 12/14/14 0751 12/14/14 1356 12/15/14 0500  BP: 124/76  130/73 132/81  Pulse: 80  87 97  Temp: 98.2 F (36.8 C)  97.4 F (36.3 C) 98.2 F (36.8 C)  TempSrc: Oral  Oral Oral  Resp: 18  20 18   Height:  5\' 11"  (1.803 m)    Weight:  129.275 kg (285 lb)    SpO2: 100%  97% 100%    Intake/Output Summary (Last 24 hours) at 12/15/14 1209 Last data filed at 12/15/14 0931  Gross per 24 hour  Intake    240 ml  Output      0 ml  Net    240 ml   Filed Weights   12/14/14 0751  Weight: 129.275 kg (285 lb)    Exam:  General: Alert, awake, oriented x3, in no acute distress.  HEENT: No bruits, no goiter.  Heart: Regular rate and rhythm. Lungs: Good air movement, clear Abdomen: Soft, nontender, nondistended, positive bowel sounds.   Data Reviewed: Basic Metabolic Panel:  Recent Labs Lab 12/13/14 1006  NA 135  K 3.6  CL 108  CO2 21  GLUCOSE 174*  BUN 6  CREATININE 0.97  CALCIUM 8.7   Liver Function Tests:  Recent Labs Lab 12/13/14 1006  AST 23  ALT 13  ALKPHOS 95  BILITOT 0.2*  PROT 7.9  ALBUMIN 4.0   No results for input(s): LIPASE, AMYLASE in the last 168 hours. No results for input(s): AMMONIA in the last 168 hours. CBC:  Recent Labs Lab 12/13/14 1006  WBC 7.3  HGB 11.3*  HCT 35.6*  MCV 85.8  PLT 225   Cardiac Enzymes: No results for input(s): CKTOTAL, CKMB, CKMBINDEX, TROPONINI in the last 168 hours. BNP (last 3 results) No results for input(s): PROBNP in the last 8760 hours. CBG:  Recent  Labs Lab 12/14/14 1128 12/14/14 1656 12/14/14 2137 12/15/14 0739  GLUCAP 89 111* 118* 72    Recent Results (from the past 240 hour(s))  Culture, blood (routine x 2)     Status: None (Preliminary result)   Collection Time: 12/13/14 10:25 AM  Result Value Ref Range Status   Specimen Description BLOOD LEFT ANTECUBITAL  Final   Special Requests BOTTLES DRAWN AEROBIC AND ANAEROBIC 5CC  Final   Culture   Final           BLOOD CULTURE RECEIVED NO GROWTH TO DATE CULTURE WILL BE HELD FOR 5 DAYS BEFORE ISSUING A FINAL NEGATIVE REPORT Performed at [facility]    Report Status PENDING  Incomplete  Culture, blood (routine x 2)     Status: None (Preliminary result)   Collection Time: 12/13/14 10:30 AM  Result Value Ref Range Status   Specimen Description BLOOD LEFT HAND  Final   Special Requests BOTTLES DRAWN AEROBIC AND ANAEROBIC 5CC  Final   Culture   Final           BLOOD CULTURE RECEIVED NO GROWTH TO DATE CULTURE WILL BE HELD FOR 5 DAYS BEFORE ISSUING A FINAL NEGATIVE REPORT Performed at [facility]  Report Status PENDING  Incomplete     Studies: No results found.  Scheduled Meds: . doxycycline (VIBRAMYCIN) IV  100 mg Intravenous Q12H  . famotidine  20 mg Oral BID  . insulin aspart  0-9 Units Subcutaneous TID WC  . loratadine  10 mg Oral Daily  . sodium chloride  3 mL Intravenous Q12H   Continuous Infusions:    Charlynne Cousins  [facility] Pager [phone]. If 8PM-8AM, please contact night-coverage at [email or web address], password [facility] 12/15/2014, 12:09 PM  LOS: 2 days

## 2014-12-16 MED ORDER — POLYETHYLENE GLYCOL 3350 17 G PO PACK
17.0000 g | PACK | Freq: Every day | ORAL | Status: DC
  Administered 2014-12-16 – 2014-12-17 (×2): 17 g via ORAL
  Filled 2014-12-16 (×2): qty 1

## 2014-12-16 NOTE — Progress Notes (Signed)
[facility] PROGRESS NOTE    Assessment/Plan: Cellulitis of face - cont antibiotics to doxy. Add mupirocin. - now improved swelling of the eyes, but neck swollen today.  Steroid-induced hyperglycemia - cont SSI.  Normocytic anemia - start ferrous sulfate.    Code Status: full Family Communication: none  Disposition Plan: inpatient   Consultants:  none  Procedures:  none  Antibiotics:  Keflex  Doxy  HPI/Subjective: Headache improved.  Objective: Filed Vitals:   12/15/14 0500 12/15/14 1329 12/15/14 2031 12/16/14 0507  BP: 132/81 123/90 130/91 117/72  Pulse: 97 93 99 84  Temp: 98.2 F (36.8 C) 98.5 F (36.9 C) 98 F (36.7 C) 98.1 F (36.7 C)  TempSrc: Oral Oral Oral Oral  Resp: 18 18 18 18   Height:      Weight:      SpO2: 100% 99% 99% 100%    Intake/Output Summary (Last 24 hours) at 12/16/14 0848 Last data filed at 12/15/14 1907  Gross per 24 hour  Intake    850 ml  Output      0 ml  Net    850 ml   Filed Weights   12/14/14 0751  Weight: 129.275 kg (285 lb)    Exam:  General: Alert, awake, oriented x3, in no acute distress.  HEENT: No bruits, no goiter.  Heart: Regular rate and rhythm. Lungs: Good air movement, clear Abdomen: Soft, nontender, nondistended, positive bowel sounds.   Data Reviewed: Basic Metabolic Panel:  Recent Labs Lab 12/13/14 1006  NA 135  K 3.6  CL 108  CO2 21  GLUCOSE 174*  BUN 6  CREATININE 0.97  CALCIUM 8.7   Liver Function Tests:  Recent Labs Lab 12/13/14 1006  AST 23  ALT 13  ALKPHOS 95  BILITOT 0.2*  PROT 7.9  ALBUMIN 4.0   No results for input(s): LIPASE, AMYLASE in the last 168 hours. No results for input(s): AMMONIA in the last 168 hours. CBC:  Recent Labs Lab 12/13/14 1006  WBC 7.3  HGB 11.3*  HCT 35.6*  MCV 85.8  PLT 225   Cardiac Enzymes: No results for input(s): CKTOTAL, CKMB, CKMBINDEX, TROPONINI in the last 168 hours. BNP (last 3 results) No results for  input(s): PROBNP in the last 8760 hours. CBG:  Recent Labs Lab 12/14/14 1128 12/14/14 1656 12/14/14 2137 12/15/14 0739 12/15/14 1231  GLUCAP 89 111* 118* 72 92    Recent Results (from the past 240 hour(s))  Culture, blood (routine x 2)     Status: None (Preliminary result)   Collection Time: 12/13/14 10:25 AM  Result Value Ref Range Status   Specimen Description BLOOD LEFT ANTECUBITAL  Final   Special Requests BOTTLES DRAWN AEROBIC AND ANAEROBIC 5CC  Final   Culture   Final           BLOOD CULTURE RECEIVED NO GROWTH TO DATE CULTURE WILL BE HELD FOR 5 DAYS BEFORE ISSUING A FINAL NEGATIVE REPORT Performed at [facility]    Report Status PENDING  Incomplete  Culture, blood (routine x 2)     Status: None (Preliminary result)   Collection Time: 12/13/14 10:30 AM  Result Value Ref Range Status   Specimen Description BLOOD LEFT HAND  Final   Special Requests BOTTLES DRAWN AEROBIC AND ANAEROBIC 5CC  Final   Culture   Final           BLOOD CULTURE RECEIVED NO GROWTH TO DATE CULTURE WILL BE HELD FOR 5 DAYS BEFORE ISSUING A FINAL  NEGATIVE REPORT Performed at [facility]    Report Status PENDING  Incomplete     Studies: No results found.  Scheduled Meds: . doxycycline (VIBRAMYCIN) IV  100 mg Intravenous Q12H  . famotidine  20 mg Oral BID  . loratadine  10 mg Oral Daily  . mupirocin ointment   Topical BID  . polyethylene glycol  17 g Oral Daily  . sodium chloride  3 mL Intravenous Q12H   Continuous Infusions:    Charlynne Cousins  [facility] Pager [phone]. If 8PM-8AM, please contact night-coverage at [email or web address], password [facility] 12/16/2014, 8:48 AM  LOS: 3 days

## 2014-12-17 MED ORDER — DOXYCYCLINE HYCLATE 100 MG PO CAPS: 100.0000 mg | ORAL_CAPSULE | Freq: Two times a day (BID) | ORAL | Status: DC

## 2014-12-17 NOTE — Progress Notes (Signed)
Pt discharged home alone. Pt vs stable at time of discharge. Pt denies any complaints or concerns at this time. Pt verbalized understanding discharge instructions, medications, and follow-up appointments.

## 2014-12-17 NOTE — Discharge Summary (Signed)
Physician Discharge Summary  Tracy Bowen UJW:[id number] DOB: 10-30-1976 DOA: 12/13/2014  PCP: No Pcp Per Pt  Admit date: 12/13/2014 Discharge date: 12/17/2014  Time spent: 35 minutes  Recommendations for Outpatient Follow-up:  1. Follow up PCP in 2 weeks.  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 She also had her eyebrows waxed a few days ago. No frank fever or chills. The left side of her face is tender, and she has noticed some weeping of the skin on her left hairline. The patient has chronic allergies, and has chronic issues with pruritis and hives, but noted that her head got very pruritic. She also had hives, but reports these resolved after being given Benadryl in the ED. She had some difficulty swallowing an Excedrin tablet and then had difficulty swallowing prednisone in the ED, but did not notice any frank swelling to her lips or tongue  Hospital Course:  Cellulitis of face: - started on oral keflex on admission with no improvement. - changed  to doxy. With  Topical Mupirocin. With improvement.  Steroid-induced hyperglycemia - cont SSI.  Normocytic anemia - start ferrous sulfate.  Procedures:  none  Consultations:  none  Discharge Exam: Filed Vitals:   12/17/14 0522  BP: 112/80  Pulse: 86  Temp: 98.1 F (36.7 C)  Resp: 20    General: A&O x3 Cardiovascular: RRR Respiratory: good air movement CTA B/L  Discharge Instructions    Current Discharge Medication List    START taking these  medications   Details  doxycycline (VIBRAMYCIN) 100 MG capsule Take 1 capsule (100 mg total) by mouth 2 (two) times daily. Qty: 20 capsule, Refills: 0    EPINEPHrine (EPIPEN 2-PAK) 0.3 mg/0.3 mL IJ SOAJ injection Inject 0.3 mLs (0.3 mg total) into the muscle once. Qty: 1 Device, Refills: 0    predniSONE (DELTASONE) 20 MG tablet Take 3 tablets (60 mg total) by mouth daily. Qty: 12 tablet, Refills: 0      CONTINUE these medications which have NOT CHANGED   Details  cetirizine (ZYRTEC) 10 MG tablet Take 10 mg by mouth daily.    diphenhydrAMINE (BENADRYL) 25 MG tablet Take 25 mg by mouth every 6 (six) hours as needed for itching or allergies.    valACYclovir (VALTREX) 500 MG tablet TAKE 1 TABLET (500 MG TOTAL) BY MOUTH 2 (TWO) TIMES DAILY. FOR 5 DAYS WITH OUTBREAK Qty: 30 tablet, Refills: 2      STOP taking these medications     metroNIDAZOLE (FLAGYL) 500 MG tablet        No Known Allergies Follow-up Information    Follow up with [facility]    . Schedule an appointment as soon as possible for a visit in 3 days.   Why:  for close follow up of your allergic reaction   Contact information:   [address] [place] [address] [phone]       The results of significant diagnostics from this hospitalization (including imaging, microbiology, ancillary and laboratory) are  listed below for reference.    Significant Diagnostic Studies: No results found.  Microbiology: Recent Results (from the past 240 hour(s))  Culture, blood (routine x 2)     Status: None (Preliminary result)   Collection Time: 12/13/14 10:25 AM  Result Value Ref Range Status   Specimen Description BLOOD LEFT ANTECUBITAL  Final   Special Requests BOTTLES DRAWN AEROBIC AND ANAEROBIC 5CC  Final   Culture   Final           BLOOD CULTURE RECEIVED NO GROWTH TO DATE CULTURE WILL BE HELD FOR 5 DAYS BEFORE ISSUING A FINAL NEGATIVE REPORT Performed at [facility]    Report Status PENDING  Incomplete  Culture, blood (routine x 2)     Status: None (Preliminary result)   Collection Time: 12/13/14 10:30 AM  Result Value Ref Range Status   Specimen Description BLOOD LEFT HAND  Final   Special Requests BOTTLES DRAWN AEROBIC AND ANAEROBIC 5CC  Final   Culture   Final           BLOOD CULTURE RECEIVED NO GROWTH TO DATE CULTURE WILL BE HELD FOR 5 DAYS BEFORE ISSUING A FINAL NEGATIVE REPORT Performed at [facility]    Report Status PENDING  Incomplete     Labs: Basic Metabolic Panel:  Recent Labs Lab 12/13/14 1006  NA 135  K 3.6  CL 108  CO2 21  GLUCOSE 174*  BUN 6  CREATININE 0.97  CALCIUM 8.7   Liver Function Tests:  Recent Labs Lab 12/13/14 1006  AST 23  ALT 13  ALKPHOS 95  BILITOT 0.2*  PROT 7.9  ALBUMIN 4.0   No results for input(s): LIPASE, AMYLASE in the last 168 hours. No results for input(s): AMMONIA in the last 168 hours. CBC:  Recent Labs Lab 12/13/14 1006  WBC 7.3  HGB 11.3*  HCT 35.6*  MCV 85.8  PLT 225   Cardiac Enzymes: No results for input(s): CKTOTAL, CKMB, CKMBINDEX, TROPONINI in the last 168 hours. BNP: BNP (last 3 results) No results for input(s): PROBNP in the last 8760 hours. CBG:  Recent Labs Lab 12/14/14 1128 12/14/14 1656 12/14/14 2137 12/15/14 0739 12/15/14 1231  GLUCAP 89 111* 118* 72 92       Signed:  FELIZ ORTIZ, ABRAHAM  [facility] 12/17/2014, 11:14 AM

## 2014-12-17 NOTE — Progress Notes (Signed)
Clinical Social Work  CSW provided letter for patient stating she was in the hospital for work.  [name], [name] [phone]

## 2014-12-19 LAB — CULTURE, BLOOD (ROUTINE X 2)
CULTURE: NO GROWTH
CULTURE: NO GROWTH

## 2015-08-06 ENCOUNTER — Ambulatory Visit (INDEPENDENT_AMBULATORY_CARE_PROVIDER_SITE_OTHER): Payer: 59 | Admitting: Family Medicine

## 2015-08-06 VITALS — BP 120/82 | HR 98 | Temp 98.1°F | Resp 16 | Ht 71.25 in | Wt 281.4 lb

## 2015-08-06 DIAGNOSIS — R8281 Pyuria: Secondary | ICD-10-CM

## 2015-08-06 DIAGNOSIS — R300 Dysuria: Secondary | ICD-10-CM | POA: Diagnosis not present

## 2015-08-06 DIAGNOSIS — N390 Urinary tract infection, site not specified: Secondary | ICD-10-CM | POA: Diagnosis not present

## 2015-08-06 DIAGNOSIS — N760 Acute vaginitis: Secondary | ICD-10-CM | POA: Diagnosis not present

## 2015-08-06 LAB — POCT URINALYSIS DIPSTICK
BILIRUBIN UA: NEGATIVE
Glucose, UA: NEGATIVE
Ketones, UA: NEGATIVE
Nitrite, UA: NEGATIVE
Protein, UA: NEGATIVE
RBC UA: NEGATIVE
UROBILINOGEN UA: 0.2
pH, UA: 5.5

## 2015-08-06 LAB — POCT WET PREP WITH KOH
KOH Prep POC: NEGATIVE
Trichomonas, UA: NEGATIVE
Yeast Wet Prep HPF POC: NEGATIVE

## 2015-08-06 LAB — POCT UA - MICROSCOPIC ONLY
CRYSTALS, UR, HPF, POC: NEGATIVE
Casts, Ur, LPF, POC: NEGATIVE
Mucus, UA: NEGATIVE
YEAST UA: NEGATIVE

## 2015-08-06 MED ORDER — CIPROFLOXACIN HCL 500 MG PO TABS: 500.0000 mg | ORAL_TABLET | Freq: Two times a day (BID) | ORAL | Status: DC

## 2015-08-06 MED ORDER — METRONIDAZOLE 500 MG PO TABS: 500.0000 mg | ORAL_TABLET | Freq: Two times a day (BID) | ORAL | Status: DC

## 2015-08-06 NOTE — Patient Instructions (Signed)
Urinary Tract Infection Urinary tract infections (UTIs) can develop anywhere along your urinary tract. Your urinary tract is your body's drainage system for removing wastes and extra water. Your urinary tract includes two kidneys, two ureters, a bladder, and a urethra. Your kidneys are a pair of bean-shaped organs. Each kidney is about the size of your fist. They are located below your ribs, one on each side of your spine. CAUSES Infections are caused by microbes, which are microscopic organisms, including fungi, viruses, and bacteria. These organisms are so small that they can only be seen through a microscope. Bacteria are the microbes that most commonly cause UTIs. SYMPTOMS  Symptoms of UTIs may vary by age and gender of the patient and by the location of the infection. Symptoms in young women typically include a frequent and intense urge to urinate and a painful, burning feeling in the bladder or urethra during urination. Older women and men are more likely to be tired, shaky, and weak and have muscle aches and abdominal pain. A fever may mean the infection is in your kidneys. Other symptoms of a kidney infection include pain in your back or sides below the ribs, nausea, and vomiting. DIAGNOSIS To diagnose a UTI, your caregiver will ask you about your symptoms. Your caregiver also will ask to provide a urine sample. The urine sample will be tested for bacteria and white blood cells. White blood cells are made by your body to help fight infection. TREATMENT  Typically, UTIs can be treated with medication. Because most UTIs are caused by a bacterial infection, they usually can be treated with the use of antibiotics. The choice of antibiotic and length of treatment depend on your symptoms and the type of bacteria causing your infection. HOME CARE INSTRUCTIONS  If you were prescribed antibiotics, take them exactly as your caregiver instructs you. Finish the medication even if you feel better after you  have only taken some of the medication.  Drink enough water and fluids to keep your urine clear or pale yellow.  Avoid caffeine, tea, and carbonated beverages. They tend to irritate your bladder.  Empty your bladder often. Avoid holding urine for long periods of time.  Empty your bladder before and after sexual intercourse.  After a bowel movement, women should cleanse from front to back. Use each tissue only once. SEEK MEDICAL CARE IF:   You have back pain.  You develop a fever.  Your symptoms do not begin to resolve within 3 days. SEEK IMMEDIATE MEDICAL CARE IF:   You have severe back pain or lower abdominal pain.  You develop chills.  You have nausea or vomiting.  You have continued burning or discomfort with urination. MAKE SURE YOU:   Understand these instructions.  Will watch your condition.  Will get help right away if you are not doing well or get worse. Document Released: 09/05/2005 Document Revised: 05/27/2012 Document Reviewed: 01/04/2012 ExitCare Patient Information 2015 ExitCare[employer] LLC. This information is not intended to replace advice given to you by your health care provider. Make sure you discuss any questions you have with your health care provider.  Bacterial Vaginosis Bacterial vaginosis is a vaginal infection that occurs when the normal balance of bacteria in the vagina is disrupted. It results from an overgrowth of certain bacteria. This is the most common vaginal infection in women of childbearing age. Treatment is important to prevent complications, especially in pregnant women, as it can cause a premature delivery. CAUSES  Bacterial vaginosis is caused by an   increase in harmful bacteria that are normally present in smaller amounts in the vagina. Several different kinds of bacteria can cause bacterial vaginosis. However, the reason that the condition develops is not fully understood. RISK FACTORS Certain activities or behaviors can put you at an  increased risk of developing bacterial vaginosis, including:  Having a new sex partner or multiple sex partners.  Douching.  Using an intrauterine device (IUD) for contraception. Women do not get bacterial vaginosis from toilet seats, bedding, swimming pools, or contact with objects around them. SIGNS AND SYMPTOMS  Some women with bacterial vaginosis have no signs or symptoms. Common symptoms include:  Grey vaginal discharge.  A fishlike odor with discharge, especially after sexual intercourse.  Itching or burning of the vagina and vulva.  Burning or pain with urination. DIAGNOSIS  Your health care provider will take a medical history and examine the vagina for signs of bacterial vaginosis. A sample of vaginal fluid may be taken. Your health care provider will look at this sample under a microscope to check for bacteria and abnormal cells. A vaginal pH test may also be done.  TREATMENT  Bacterial vaginosis may be treated with antibiotic medicines. These may be given in the form of a pill or a vaginal cream. A second round of antibiotics may be prescribed if the condition comes back after treatment.  HOME CARE INSTRUCTIONS   Only take over-the-counter or prescription medicines as directed by your health care provider.  If antibiotic medicine was prescribed, take it as directed. Make sure you finish it even if you start to feel better.  Do not have sex until treatment is completed.  Tell all sexual partners that you have a vaginal infection. They should see their health care provider and be treated if they have problems, such as a mild rash or itching.  Practice safe sex by using condoms and only having one sex partner. SEEK MEDICAL CARE IF:   Your symptoms are not improving after 3 days of treatment.  You have increased discharge or pain.  You have a fever. MAKE SURE YOU:   Understand these instructions.  Will watch your condition.  Will get help right away if you are not  doing well or get worse. FOR MORE INFORMATION  Centers for Disease Control and Prevention, Division of STD Prevention: www.cdc.gov/std American Sexual Health Association (ASHA): www.ashastd.org  Document Released: 11/26/2005 Document Revised: 09/16/2013 Document Reviewed: 07/08/2013 ExitCare Patient Information 2015 ExitCare[employer]. This information is not intended to replace advice given to you by your health care provider. Make sure you discuss any questions you have with your health care provider.  

## 2015-08-06 NOTE — Progress Notes (Addendum)
@UMFCLOGO @  This chart was scribed for Robyn Haber, MD by Thea Alken, ED Scribe. This patient was seen in room 4 and the patient's care was started at 11:00 AM.  Patient ID: Tracy Bowen MRN: [id number], DOB: [birth year], 39 y.o. Date of Encounter: 08/06/2015, 10:59 AM  Primary Physician: No Pcp Per Pt  Chief Complaint:  Chief Complaint  Patient presents with  . Dysuria  . Vaginal Itching    HPI: 39 y.o. year old female with history below presents with dysuria for about 1 week. Pt recently developed vaginal itching a couple days ago. LMP was the middle of the middle of this month. Pt is single. No fever, chills and abdominal pain. Pt does not want STD testing.   She recently had an allergic reaction and no longer has her epi pen.  Pt is a Pharmacist, hospital at [employer]   Past Medical History  Diagnosis Date  . HSV-2 infection   . Seasonal allergies   . Mild acid reflux WATCHES DIET  . Depression   . Anxiety   . Leiomyoma 09/2012     Home Meds: Prior to Admission medications   Medication Sig Start Date End Date Taking? Authorizing Provider  cetirizine (ZYRTEC) 10 MG tablet Take 10 mg by mouth daily.   Yes Historical Provider, MD  diphenhydrAMINE (BENADRYL) 25 MG tablet Take 25 mg by mouth every 6 (six) hours as needed for itching or allergies.   Yes Historical Provider, MD  EPINEPHrine (EPIPEN 2-PAK) 0.3 mg/0.3 mL IJ SOAJ injection Inject 0.3 mLs (0.3 mg total) into the muscle once. 12/13/14  Yes Everlene Balls, MD  valACYclovir (VALTREX) 500 MG tablet TAKE 1 TABLET (500 MG TOTAL) BY MOUTH 2 (TWO) TIMES DAILY. FOR 5 DAYS WITH OUTBREAK 12/18/13  Yes Anastasio Auerbach, MD    Allergies: No Known Allergies  Social History   Social History  . Marital Status: Single    Spouse Name: N/A  . Number of Children: 1  . Years of Education: N/A   Occupational History  . Student     Social History Main Topics  . Smoking status: Never Smoker   . Smokeless tobacco: Never Used  .  Alcohol Use: No  . Drug Use: No  . Sexual Activity: Not Currently    Birth Control/ Protection: Surgical     Comment: Tubal lig   Other Topics Concern  . Not on file   Social History Narrative   Single.  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  Lives with a family friend.     Review of Systems: Constitutional: negative for chills, fever, night sweats, weight changes, or fatigue  HEENT: negative for vision changes, hearing loss, congestion, rhinorrhea, ST, epistaxis, or sinus pressure Cardiovascular: negative for chest pain or palpitations Respiratory: negative for hemoptysis, wheezing, shortness of breath, or cough Abdominal: negative for abdominal pain, nausea, vomiting, diarrhea, or constipation Dermatological: negative for rash Neurologic: negative for headache, dizziness, or syncope All other systems reviewed and are otherwise negative with the exception to those above and in the HPI.   Physical Exam: Blood pressure 120/82, pulse 98, temperature 98.1 F (36.7 C), temperature source Oral, resp. rate 16, height 5' 11.25" (1.81 m), weight 281 lb 6.4 oz (127.642 kg), last menstrual period 07/26/2015, SpO2 98 %., Body mass index is 38.96 kg/(m^2). General: Well developed, well nourished, in no acute distress. Head: Normocephalic, atraumatic, eyes without discharge, sclera non-icteric, nares are without discharge. Bilateral auditory canals clear, TM's are without perforation, pearly  grey and translucent with reflective cone of light bilaterally. Oral cavity moist, posterior pharynx without exudate, erythema, peritonsillar abscess, or post nasal drip.  Neck: Supple. No thyromegaly. Full ROM. No lymphadenopathy. Lungs: Clear bilaterally to auscultation without wheezes, rales, or rhonchi. Breathing is unlabored. Heart: RRR with S1 S2. No murmurs, rubs, or gallops appreciated. Abdomen: Soft, non-tender, non-distended with normoactive bowel sounds. No hepatomegaly. No  rebound/guarding. No obvious abdominal masses. Msk:  Strength and tone normal for age. Extremities/Skin: Warm and dry. No clubbing or cyanosis. No edema. No rashes or suspicious lesions. Neuro: Alert and oriented X 3. Moves all extremities spontaneously. Gait is normal. CNII-XII grossly in tact. Psych:  Responds to questions appropriately with a normal affect. GU exam: Thicken in the labia and scant white vaginal discharge.   Results for orders placed or performed in visit on 08/06/15  POCT urinalysis dipstick  Result Value Ref Range   Color, UA yellow    Clarity, UA clear    Glucose, UA neg    Bilirubin, UA neg    Ketones, UA neg    Spec Grav, UA >=1.030    Blood, UA neg    pH, UA 5.5    Protein, UA neg    Urobilinogen, UA 0.2    Nitrite, UA neg    Leukocytes, UA small (1+) (A) Negative  POCT UA - Microscopic Only  Result Value Ref Range   WBC, Ur, HPF, POC 8-12    RBC, urine, microscopic 1-3    Bacteria, U Microscopic small    Mucus, UA neg    Epithelial cells, urine per micros 1-3    Crystals, Ur, HPF, POC neg    Casts, Ur, LPF, POC neg    Yeast, UA neg   POCT Wet Prep with KOH  Result Value Ref Range   Trichomonas, UA Negative    Clue Cells Wet Prep HPF POC 1-3    Epithelial Wet Prep HPF POC Moderate Few, Moderate, Many   Yeast Wet Prep HPF POC neg    Bacteria Wet Prep HPF POC Moderate (A) None, Few   RBC Wet Prep HPF POC 1-2    WBC Wet Prep HPF POC 35-45    KOH Prep POC Negative      ASSESSMENT AND PLAN:  39 y.o. year old female with  This chart was scribed in my presence and reviewed by me personally.    ICD-9-CM ICD-10-CM   1. Dysuria 788.1 R30.0 POCT urinalysis dipstick     POCT UA - Microscopic Only  2. Vaginitis and vulvovaginitis 616.10 N76.0 POCT Wet Prep with KOH     metroNIDAZOLE (FLAGYL) 500 MG tablet  3. Pyuria 791.9 N39.0 ciprofloxacin (CIPRO) 500 MG tablet     Signed, Robyn Haber, MD 08/06/2015 10:59 AM

## 2015-10-18 ENCOUNTER — Encounter: Payer: Self-pay | Admitting: Gynecology

## 2015-10-18 ENCOUNTER — Ambulatory Visit (INDEPENDENT_AMBULATORY_CARE_PROVIDER_SITE_OTHER): Payer: BC Managed Care – PPO | Admitting: Gynecology

## 2015-10-18 VITALS — BP 124/76

## 2015-10-18 DIAGNOSIS — N760 Acute vaginitis: Secondary | ICD-10-CM

## 2015-10-18 LAB — WET PREP FOR TRICH, YEAST, CLUE
CLUE CELLS WET PREP: NONE SEEN
Trich, Wet Prep: NONE SEEN

## 2015-10-18 MED ORDER — FLUCONAZOLE 150 MG PO TABS: 150.0000 mg | ORAL_TABLET | Freq: Every day | ORAL | Status: DC

## 2015-10-18 NOTE — Patient Instructions (Addendum)
Take the Diflucan pill daily for 3 days.  Follow-up if your symptoms persist, worsen or recur. 

## 2015-10-18 NOTE — Progress Notes (Signed)
Tracy Bowen August 12, 1976 [id number]        39 y.o.  [id number] Presents with several days of vaginal discharge, itching and odor.  No dysuria frequency urgency her low back pain. No fever or chills.  Past medical history,surgical history, problem list, medications, allergies, family history and social history were all reviewed and documented in the EPIC chart.  Directed ROS with pertinent positives and negatives documented in the history of present illness/assessment and plan.  Exam: [name] assistant Filed Vitals:   10/18/15 1603  BP: 124/76   General appearance:  Normal Abdomen soft nontender without masses guarding rebound Pelvic external BUS vagina with frothy white discharge. Cervix normal. Uterus grossly normal midline mobile nontender. Adnexa without masses or tenderness  Assessment/Plan:  39 y.o. [id number] with the above history and exam. Wet prep positive for yeast. Will treat with Diflucan 150 mg daily 3 days given the abundance of discharge. Patient will call if symptoms persist, worsen or recur.    Anastasio Auerbach MD, 4:36 PM 10/18/2015

## 2015-10-26 ENCOUNTER — Telehealth: Payer: Self-pay | Admitting: *Deleted

## 2015-10-26 MED ORDER — METRONIDAZOLE 500 MG PO TABS: 500.0000 mg | ORAL_TABLET | Freq: Two times a day (BID) | ORAL | Status: DC

## 2015-10-26 NOTE — Telephone Encounter (Signed)
Left on voicemail Rx sent, and to avoid alcohol while taking

## 2015-10-26 NOTE — Telephone Encounter (Signed)
Pt was seen on 10/18/15 OV treated for yeast, called today c./o vaginal odor and yellowish discharge, told to call if no better. Please advise

## 2015-10-26 NOTE — Telephone Encounter (Signed)
Going to cover her for a bacterial overgrowth. Flagyl 500 mg twice a day 7 days. Avoid alcohol while taking.

## 2015-12-13 ENCOUNTER — Telehealth: Payer: Self-pay

## 2015-12-13 MED ORDER — METRONIDAZOLE 500 MG PO TABS: 500.0000 mg | ORAL_TABLET | Freq: Two times a day (BID) | ORAL | Status: DC

## 2015-12-13 NOTE — Telephone Encounter (Signed)
Okay for refill for Flagyl 500 mg twice a day 7 days. Alcohol avoidance while taking. Patient appears to be overdue for her annual exam which last one scheduled was 08/2014.

## 2015-12-13 NOTE — Telephone Encounter (Addendum)
Patient called in voice mail requesting refill on Metronidazole.  She states she has this every mos after her period ends.  I called patient and she called back. She left message in voice mail that her symptoms are vaginal discharge clear to yellowish, a "weird odor", and only slight itching.  She said office visit is difficult for her because she works in [place].

## 2015-12-13 NOTE — Telephone Encounter (Signed)
Left message in voice mail. Advised patient to call and schedule CE asap.  Rx sent.

## 2016-01-02 ENCOUNTER — Telehealth: Payer: Self-pay | Admitting: Obstetrics and Gynecology

## 2016-01-02 MED ORDER — VALACYCLOVIR HCL 500 MG PO TABS: 500.0000 mg | ORAL_TABLET | Freq: Two times a day (BID) | ORAL | Status: DC

## 2016-01-02 NOTE — Telephone Encounter (Signed)
Phone call returned to patient after hours.  Patient states she is having itching where she usually has HSV outbreaks.  Used Valtrex in the past, and this has worked well in the past.  Rx to pharmacy for valtrex 500 mg po bid for 3 days.  #30, RF none.

## 2016-01-03 ENCOUNTER — Encounter: Payer: Self-pay | Admitting: Gynecology

## 2016-01-24 ENCOUNTER — Ambulatory Visit (INDEPENDENT_AMBULATORY_CARE_PROVIDER_SITE_OTHER): Payer: BC Managed Care – PPO | Admitting: Family Medicine

## 2016-01-24 VITALS — BP 123/78 | HR 108 | Temp 98.0°F | Resp 16 | Ht 71.0 in | Wt 275.0 lb

## 2016-01-24 DIAGNOSIS — J111 Influenza due to unidentified influenza virus with other respiratory manifestations: Secondary | ICD-10-CM | POA: Diagnosis not present

## 2016-01-24 DIAGNOSIS — R05 Cough: Secondary | ICD-10-CM

## 2016-01-24 DIAGNOSIS — R059 Cough, unspecified: Secondary | ICD-10-CM

## 2016-01-24 DIAGNOSIS — R509 Fever, unspecified: Secondary | ICD-10-CM

## 2016-01-24 MED ORDER — OSELTAMIVIR PHOSPHATE 75 MG PO CAPS: 75.0000 mg | ORAL_CAPSULE | Freq: Two times a day (BID) | ORAL | Status: DC

## 2016-01-24 MED ORDER — HYDROCODONE-HOMATROPINE 5-1.5 MG/5ML PO SYRP: 5.0000 mL | ORAL_SOLUTION | Freq: Three times a day (TID) | ORAL | Status: DC | PRN

## 2016-01-24 NOTE — Patient Instructions (Signed)

## 2016-01-24 NOTE — Progress Notes (Signed)
By signing my name below, I, Moises Blood, attest that this documentation has been prepared under the direction and in the presence of Robyn Haber, MD. Electronically Signed: Moises Blood, [place]. 01/24/2016 , 4:58 PM .  Patient was seen in room 11 .   Patient ID: Tracy Bowen MRN: [id number], DOB: Jul 08, 1976, 40 y.o. Date of Encounter: 01/24/2016  Primary Physician: No Pcp Per Pt  Chief Complaint:  Chief Complaint  Patient presents with  . Sinusitis    x 3 days  . Sore Throat    x 3 day  . Cough    x 3 days  . Headache    HPI:  Tracy Bowen is a 40 y.o. female who presents to [facility] complaining of sinus congestion with sore throat and cough that started 3 days ago. She states that it started with a headache with chest congestion, and some dizziness.   She works as a Pharmacist, hospital.   Past Medical History  Diagnosis Date  . HSV-2 infection   . Seasonal allergies   . Mild acid reflux WATCHES DIET  . Depression   . Anxiety   . Leiomyoma 09/2012     Home Meds: Prior to Admission medications   Medication Sig Start Date End Date Taking? Authorizing Provider  diphenhydrAMINE (BENADRYL) 25 MG tablet Take 25 mg by mouth every 6 (six) hours as needed for itching or allergies.   Yes Historical Provider, MD  valACYclovir (VALTREX) 500 MG tablet Take 1 tablet (500 mg total) by mouth 2 (two) times daily. Take one tablet BID at onset of symptoms for 3 days. 01/02/16  Yes Brook E Yisroel Ramming, MD  cetirizine (ZYRTEC) 10 MG tablet Take 10 mg by mouth daily. Reported on 01/24/2016    Historical Provider, MD  EPINEPHrine (EPIPEN 2-PAK) 0.3 mg/0.3 mL IJ SOAJ injection Inject 0.3 mLs (0.3 mg total) into the muscle once. Patient not taking: Reported on 01/24/2016 12/13/14   Everlene Balls, MD  fluconazole (DIFLUCAN) 150 MG tablet Take 1 tablet (150 mg total) by mouth daily. For 3 days Patient not taking: Reported on 01/24/2016 10/18/15   Anastasio Auerbach, MD   metroNIDAZOLE (FLAGYL) 500 MG tablet Take 1 tablet (500 mg total) by mouth 2 (two) times daily. Patient not taking: Reported on 01/24/2016 12/13/15   Anastasio Auerbach, MD    Allergies: No Known Allergies  Social History   Social History  . Marital Status: Single    Spouse Name: N/A  . Number of Children: 1  . Years of Education: N/A   Occupational History  . Student     Social History Main Topics  . Smoking status: Never Smoker   . Smokeless tobacco: Never Used  . Alcohol Use: No  . Drug Use: No  . Sexual Activity: Not Currently    Birth Control/ Protection: Surgical     Comment: Tubal lig   Other Topics Concern  . Not on file   Social History Narrative   Single.  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  Lives with a family friend.     Review of Systems: Constitutional: negative for fever, chills, night sweats, weight changes, or fatigue  HEENT: negative for vision changes, hearing loss, rhinorrhea, epistaxis; positive for sinusitis, sore throat, congestion Cardiovascular: negative for chest pain or palpitations Respiratory: negative for hemoptysis, wheezing, shortness of breath; positive for cough Abdominal: negative for abdominal pain, nausea, vomiting, diarrhea, or constipation Dermatological: negative for rash Neurologic: negative  for syncope; positive for headache, dizziness All other systems reviewed and are otherwise negative with the exception to those above and in the HPI.  Physical Exam: Blood pressure 123/78, pulse 108, temperature 98 F (36.7 C), temperature source Oral, resp. rate 16, height 5\' 11"  (1.803 m), weight 275 lb (124.739 kg), last menstrual period 01/23/2016, SpO2 96 %., Body mass index is 38.37 kg/(m^2). General: Well developed, well nourished, in no acute distress. Head: Normocephalic, atraumatic, eyes without discharge, sclera non-icteric. Bilateral auditory canals clear, TM's are without perforation, pearly grey and translucent with  reflective cone of light bilaterally. Oral cavity moist, posterior pharynx without exudate, erythema, peritonsillar abscess, or post nasal drip. Nasal passages swollen Neck: Supple. No thyromegaly. Full ROM. No lymphadenopathy. Lungs: Clear bilaterally to auscultation without wheezes, rales, or rhonchi. Breathing is unlabored. "barky" cough Heart: RRR with S1 S2. No murmurs, rubs, or gallops appreciated. Msk:  Strength and tone normal for age. Extremities/Skin: Warm and dry. No clubbing or cyanosis. No edema. No rashes or suspicious lesions. Neuro: Alert and oriented X 3. Moves all extremities spontaneously. Gait is normal. CNII-XII grossly in tact. Psych:  Responds to questions appropriately with a normal affect.   Labs: Results for orders placed or performed in visit on 10/18/15  WET PREP FOR [place], YEAST, CLUE  Result Value Ref Range   Yeast Wet Prep HPF POC MOD (A) NONE SEEN   Trich, Wet Prep NONE SEEN NONE SEEN   Clue Cells Wet Prep HPF POC NONE SEEN NONE SEEN   WBC, Wet Prep HPF POC TNTC (A) NONE SEEN     ASSESSMENT AND PLAN:  40 y.o. year old female with  This chart was scribed in my presence and reviewed by me personally.    ICD-9-CM ICD-10-CM   1. Cough 786.2 R05 POCT Influenza A/B     oseltamivir (TAMIFLU) 75 MG capsule     HYDROcodone-homatropine (HYCODAN) 5-1.5 MG/5ML syrup  2. Fever, unspecified fever cause 780.60 R50.9 POCT Influenza A/B     oseltamivir (TAMIFLU) 75 MG capsule  3. Influenza with respiratory manifestation 487.1 J11.1 oseltamivir (TAMIFLU) 75 MG capsule     Signed, Robyn Haber, MD     Signed, Robyn Haber, MD 01/24/2016 4:58 PM

## 2016-02-01 ENCOUNTER — Encounter (HOSPITAL_COMMUNITY): Payer: Self-pay

## 2016-02-01 ENCOUNTER — Emergency Department (HOSPITAL_COMMUNITY)
Admission: EM | Admit: 2016-02-01 | Discharge: 2016-02-01 | Disposition: A | Discharge status: Home / Self Care | Payer: BC Managed Care – PPO | Attending: Emergency Medicine | Admitting: Emergency Medicine

## 2016-02-01 DIAGNOSIS — K5900 Constipation, unspecified: Secondary | ICD-10-CM | POA: Insufficient documentation

## 2016-02-01 DIAGNOSIS — Z8619 Personal history of other infectious and parasitic diseases: Secondary | ICD-10-CM | POA: Insufficient documentation

## 2016-02-01 DIAGNOSIS — Z8542 Personal history of malignant neoplasm of other parts of uterus: Secondary | ICD-10-CM | POA: Diagnosis not present

## 2016-02-01 DIAGNOSIS — Z9851 Tubal ligation status: Secondary | ICD-10-CM | POA: Diagnosis not present

## 2016-02-01 DIAGNOSIS — Z8639 Personal history of other endocrine, nutritional and metabolic disease: Secondary | ICD-10-CM | POA: Diagnosis not present

## 2016-02-01 MED ORDER — SENNA 8.6 MG PO TABS: 1.0000 | ORAL_TABLET | Freq: Two times a day (BID) | ORAL | Status: DC | PRN

## 2016-02-01 NOTE — Discharge Instructions (Signed)
Take magnesium citrate 150mg  every 12 hours. You should continue to take [place]. If this you have no relief in the the next 24 hours you should start taking miralax until you have a bowel movement. You may also try a fleet enema which are available over -the counter  Do not hesitate to return to the emergency room for any new, worsening or concerning symptoms.  Please obtain primary care using resource guide below. Let them know that you were seen in the emergency room and that they will need to obtain records for further outpatient management.   Constipation, Adult Constipation is when a person has fewer than three bowel movements a week, has difficulty having a bowel movement, or has stools that are dry, hard, or larger than normal. As people grow older, constipation is more common. A low-fiber diet, not taking in enough fluids, and taking certain medicines may make constipation worse.  CAUSES   Certain medicines, such as antidepressants, pain medicine, iron supplements, antacids, and water pills.   Certain diseases, such as diabetes, irritable bowel syndrome (IBS), thyroid disease, or depression.   Not drinking enough water.   Not eating enough fiber-rich foods.   Stress or travel.   Lack of physical activity or exercise.   Ignoring the urge to have a bowel movement.   Using laxatives too much.  SIGNS AND SYMPTOMS   Having fewer than three bowel movements a week.   Straining to have a bowel movement.   Having stools that are hard, dry, or larger than normal.   Feeling full or bloated.   Pain in the lower abdomen.   Not feeling relief after having a bowel movement.  DIAGNOSIS  Your health care provider will take a medical history and perform a physical exam. Further testing may be done for severe constipation. Some tests may include:  A barium enema X-ray to examine your rectum, colon, and, sometimes, your small intestine.   A sigmoidoscopy to examine  your lower colon.   A colonoscopy to examine your entire colon. TREATMENT  Treatment will depend on the severity of your constipation and what is causing it. Some dietary treatments include drinking more fluids and eating more fiber-rich foods. Lifestyle treatments may include regular exercise. If these diet and lifestyle recommendations do not help, your health care provider may recommend taking over-the-counter laxative medicines to help you have bowel movements. Prescription medicines may be prescribed if over-the-counter medicines do not work.  HOME CARE INSTRUCTIONS   Eat foods that have a lot of fiber, such as fruits, vegetables, whole grains, and beans.  Limit foods high in fat and processed sugars, such as french fries, hamburgers, cookies, candies, and soda.   A fiber supplement may be added to your diet if you cannot get enough fiber from foods.   Drink enough fluids to keep your urine clear or pale yellow.   Exercise regularly or as directed by your health care provider.   Go to the restroom when you have the urge to go. Do not hold it.   Only take over-the-counter or prescription medicines as directed by your health care provider. Do not take other medicines for constipation without talking to your health care provider first.  [place] IF:   You have bright red blood in your stool.   Your constipation lasts for more than 4 days or gets worse.   You have abdominal or rectal pain.   You have thin, pencil-like stools.   You have unexplained  weight loss. MAKE SURE YOU:   Understand these instructions.  Will watch your condition.  Will get help right away if you are not doing well or get worse.   This information is not intended to replace advice given to you by your health care provider. Make sure you discuss any questions you have with your health care provider.   Document Released: 08/24/2004 Document Revised: 12/17/2014 Document  Reviewed: 09/07/2013 Elsevier Interactive Patient Education 2016 [employer].   Emergency Department Resource Guide 1) Find a Doctor and Pay Out of Pocket Although you won't have to find out who is covered by your insurance plan, it is a good idea to ask around and get recommendations. You will then need to call the office and see if the doctor you have chosen will accept you as a new patient and what types of options they offer for patients who are self-pay. Some doctors offer discounts or will set up payment plans for their patients who do not have insurance, but you will need to ask so you aren't surprised when you get to your appointment.  2) Contact Your Local Health Department Not all health departments have doctors that can see patients for sick visits, but many do, so it is worth a call to see if yours does. If you don't know where your local health department is, you can check in your phone book. The CDC also has a tool to help you locate your state's health department, and many state websites also have listings of all of their local health departments.  3) Find a [facility] If your illness is not likely to be very severe or complicated, you may want to try a walk in clinic. These are popping up all over the country in pharmacies, drugstores, and shopping centers. They're usually staffed by nurse practitioners or physician assistants that have been trained to treat common illnesses and complaints. They're usually fairly quick and inexpensive. However, if you have serious medical issues or chronic medical problems, these are probably not your best option.  No Primary Care Doctor: - Call Health Connect at  [phone] - they can help you locate a primary care doctor that  accepts your insurance, provides certain services, etc. - Physician Referral Service- [phone]  Chronic Pain Problems: Organization         Address  Phone   Notes  [facility]  [phone]  Patients need to be referred by their primary care doctor.   Medication Assistance: Organization         Address  Phone   Notes  [facility] Medication Mid - [facility] [address] [place], [place] [address] [phone] --Must be a resident of [facility] -- Must have NO insurance coverage whatsoever (no Medicaid/ Medicare, etc.) -- The pt. MUST have a primary care doctor that directs their care regularly and follows them in the community   MedAssist  [phone]   [employer]  [phone]    Agencies that provide inexpensive medical care: Organization         Address  Phone   Notes  [place]  [phone]   [facility]    [phone]   [facility] [place], [place] [address] [phone]   [place] [address], [place] [phone]   Planned Parenthood    [phone]   [facility]    [phone]   [address], [place] Phone:  [phone], Fax:  [phone] Hours of Operation:  9 am - 6 pm, M-F.  Also accepts Medicaid/Medicare and self-pay.  [facility] for [place], [address], [place] Phone: [phone], Fax: [phone]. Hours of Operation:  8:30 am - 5:30 pm, M-F.  Also accepts Medicaid and self-pay.  [facility] [place] [address], [place] Phone: [phone]   [place], [place], [place] [phone], Ext. [phone] Mondays & Thursdays: 7-9 AM.  First 15 patients are seen on a first come, first serve basis.    [place] Providers:  Organization         Address  Phone   Notes  [facility] [address], [place] [phone] Also accepts self-pay patients.  [facility] [id number] [place], [place]  [phone]   [place], [address], [place] [phone]   [facility] [address] [place] [phone]   Lucianne Lei [address], [place]   [phone] Only accepts [place] Access [place] patients after they have their name applied to their card.   Self-Pay (no insurance) in [facility]:  Organization         Address  Phone   Notes  Sickle Cell Patients, [facility] [place] [phone]   [facility] [place] [phone]   [facility] [place]  [place], [place], [place] [phone]   [facility]/Dr. Osei-Bonsu  [address] [place] or [address], [place] [phone] Phone number for both [place] and [place] locations is the same.  [facility] [address], [place] [phone]   [facility] [address] [place] or [address] [phone]   [facility] [address] [place] [phone], phone; [phone], fax Sees patients 1st and 3rd Saturday of every month.  Must not qualify for public or private insurance (i.e. Medicaid, Medicare, [place] Health Choice, Veterans' Benefits)  Household income should be no more than 200% of the poverty level The clinic cannot treat you if you are pregnant or think you are pregnant  Sexually transmitted diseases are not treated at the clinic.    Dental Care: Organization         Address  Phone  Notes  [facility] Department [facility] [place] [phone] Accepts children up to age 53 who are enrolled in [place] or [place]; pregnant women with a Medicaid card; and children who have applied for Medicaid or [place] Health Choice, but were  declined, whose parents can pay a reduced fee at time of service.  [facility] Department of [facility]  [address], [place] [phone] Accepts children up to age 49 who are enrolled in [place] or [place]; pregnant women with a Medicaid card; and children who have applied for Medicaid or [place] Health Choice, but were declined, whose parents can pay a reduced fee at time of service.  [place]  Access PROGRAM  [place] [phone] Patients are seen by appointment only. Walk-ins are not accepted. [place] will see patients 92 years of age and older. Monday - Tuesday (8am-5pm) Most Wednesdays (8:30-5pm) $30 per visit, cash only  [facility] Adult Dental Access PROGRAM  [address], [facility] [phone] Patients are seen by appointment only. Walk-ins are not accepted. [place] will see patients 55 years of age and older. One Wednesday Evening (Monthly: Volunteer Based).  $30 per visit, cash only  [place]  [phone] for adults; Children under age 68, call Graduate Pediatric Dentistry at [phone]. Children aged 60-14, please call [phone] to request a pediatric application.  Dental services are provided in all areas of dental care including fillings, crowns and bridges, complete and partial dentures, implants, gum treatment, root canals, and extractions. Preventive care is also provided. Treatment is provided to both adults and children. Patients are selected via a lottery and there is often a waiting list.   [facility] [address] [place]  [phone] [email or web address]   [facility] [address] [place], [place] [phone], Ext. [phone] Second and Fourth Thursday of each month, opens at 6:30 AM; Clinic ends at 9 AM.  Patients are seen on a first-come first-served basis, and a limited number are seen during each clinic.    [facility]  [address] Hillard Danker [place], [place] [phone]   Eligibility Requirements You must have lived in [place], [place], or [place] counties for at least the last three months.   You cannot be eligible for state or federal sponsored [employer], including [employer], [place], or [employer].   You generally cannot be eligible for healthcare insurance through your employer.    How to apply: Eligibility screenings are held every Tuesday and Wednesday afternoon from 1:00 pm until 4:00 pm. You do not need an appointment for the interview!  [facility] [address] [place], [place]   [place]  [place] Department  [place]  [phone]    Behavioral Health Resources in the Community: Intensive Outpatient Programs Organization         Address  Phone  Notes  [place]. [address], [place], [place] [phone]   [facility] Outpatient [address] [place], [place]   ADS: Alcohol & Drug Svcs [address] [place], [place]   [place] [address],  [place], [place] or [phone]   Substance Abuse Resources Organization         Address  Phone  Notes  Alcohol and Drug Services  [phone]   [place]  [phone]   [facility]   Chinita Pester  [phone]   Residential & Outpatient Substance Abuse Program  [phone]   Psychological Services Organization         Address  Phone  Notes  [facility] [place]  [place]  [phone]   [place] [address] [place] or [phone]    Mobile Crisis Teams Organization         Address  Phone  Notes  [facility], Mobile Crisis Care  Unit  [phone]   [facility] Psychotherapeutic Services  [address] [place],  [place] [address] [place] [phone]    Self-Help/Support Groups Organization         Address  Phone             Notes  [facility] [place] - variety of support groups  [place] Call for more information  Narcotics Anonymous (NA), [facility] [address], [employer] [place]  2 meetings at this location   Special educational needs teacher         Address  Phone  Notes  [facility]idential [facility] [place],    [place]  [phone]   [facility]  [address] [place] [phone], [place], [place]   [place], [place] [phone] Admissions: 8am-3pm M-F  Incentives Substance [place] [address],    [place], [place] [phone]   [facility] [address] [place], [place], [place]   [facility] [address],  [place], [place]   [facility] - Intensive Outpatient [address], [place], [place]   [facility] ([place]  [place], [place] [phone] or [phone]   [facility] (RTS) [address] [place], [place] Accepts Medicaid  [facility] [place] [address]  [place] [phone] Substance Abuse/Addiction Treatment   [facility] Organization         Address  Phone  Notes  [facility]  [phone]   Domenic Schwab, PhD [address] Arlis Porta [place], [place]   [phone] or [phone]   [place], [place] [phone]   [facility] [address] [place], [place] [phone] Insurance/Medicaid/sponsorship through [facility] [address], [address] [place]                                     [place], [place] [phone] [place] [address] [place], [place] [phone]    Dr. Adele Schilder  [phone]   [facility] [place] Dept. 1) [address] [place] 2) [place] 3)  [place] [address], [place] [phone]  [phone]   [place] [phone] or [phone] (After Hours)

## 2016-02-01 NOTE — ED Provider Notes (Signed)
CSN: [id number]     Arrival date & time 02/01/16  0744 History   First MD Initiated Contact with Patient 02/01/16 1315     Chief Complaint  Patient presents with  . Constipation     (Consider location/radiation/quality/duration/timing/severity/associated sxs/prior Treatment) HPI   Blood pressure 140/77, pulse 91, temperature 98.1 F (36.7 C), temperature source Oral, resp. rate 17, last menstrual period 01/23/2016, SpO2 100 %.  Tracy Bowen is a 40 y.o. female complaining of Constipation, states she's not clear she had a bowel movement over the last 5 days. She was written a prescription for Hycodan for influenza-like illness, she been taking this regularly, she DC'd it proximally 2 days ago but still hasn't had a good bowel movement. She is passing flatus normally. Patient is tried over-the-counter medications and try to manual disimpaction at home with little relief. Patient denies previous abdominal surgeries, fever, chills, nausea, vomiting. States that she feels like her abdomen is distended and endorses a generalized abdominal pain which she rates at 4 out of 10.  Past Medical History  Diagnosis Date  . HSV-2 infection   . Seasonal allergies   . Mild acid reflux WATCHES DIET  . Depression   . Anxiety   . Leiomyoma 09/2012   Past Surgical History  Procedure Laterality Date  . Knee arthroscopy w/ acl reconstruction and patella graft  02-15-2000    RIGHT KNEE  W/ POST DRAIN REMOVAL 02-16-2000  . Laparoscopic cholecystectomy  10-23-2007  . Dilation and curettage of uterus  1997    W/ SUCTION  . Induced abortion    . Tubal ligation  10/03/2012    Procedure: BILATERAL TUBAL LIGATION;  Surgeon: Anastasio Auerbach, MD;  Location: [facility];  Service: Gynecology;  Laterality: Bilateral;  Laparoscopic Tubal Ligation with Fallope Rings.  2nd choice is 10/10/12 at 7:30am.   Family History  Problem Relation Age of Onset  . Hypertension Mother   . Cancer  Maternal Grandmother     LUNG CANCER  . Diabetes Paternal Grandmother    Social History  Substance Use Topics  . Smoking status: Never Smoker   . Smokeless tobacco: Never Used  . Alcohol Use: No   OB History    Gravida Para Term Preterm AB TAB SAB Ectopic Multiple Living   3 1   2 2    1      Review of Systems  10 systems reviewed and found to be negative, except as noted in the HPI.   Allergies  Review of patient's allergies indicates no known allergies.  Home Medications   Prior to Admission medications   Medication Sig Start Date End Date Taking? Authorizing Provider  cetirizine (ZYRTEC) 10 MG tablet Take 10 mg by mouth every other day. Reported on 01/24/2016   Yes Historical Provider, MD  HYDROcodone-homatropine (HYCODAN) 5-1.5 MG/5ML syrup Take 5 mLs by mouth every 8 (eight) hours as needed for cough. 01/24/16  Yes Robyn Haber, MD  valACYclovir (VALTREX) 500 MG tablet Take 1 tablet (500 mg total) by mouth 2 (two) times daily. Take one tablet BID at onset of symptoms for 3 days. 01/02/16  Yes Brook E Yisroel Ramming, MD  EPINEPHrine (EPIPEN 2-PAK) 0.3 mg/0.3 mL IJ SOAJ injection Inject 0.3 mLs (0.3 mg total) into the muscle once. Patient not taking: Reported on 01/24/2016 12/13/14   Everlene Balls, MD  oseltamivir (TAMIFLU) 75 MG capsule Take 1 capsule (75 mg total) by mouth 2 (two) times daily. Patient not taking: Reported  on 02/01/2016 01/24/16   Robyn Haber, MD   BP 140/77 mmHg  Pulse 91  Temp(Src) 98.1 F (36.7 C) (Oral)  Resp 17  SpO2 100%  LMP 01/23/2016 Physical Exam  Constitutional: She is oriented to person, place, and time. She appears well-developed and well-nourished. No distress.  HENT:  Head: Normocephalic and atraumatic.  Mouth/Throat: Oropharynx is clear and moist.  Eyes: Conjunctivae and EOM are normal. Pupils are equal, round, and reactive to light.  Neck: Normal range of motion.  Cardiovascular: Normal rate, regular rhythm and intact distal pulses.    Pulmonary/Chest: Effort normal and breath sounds normal.  Abdominal: Soft. There is no tenderness.  Mild, diffuse tenderness to palpation with no guarding or rebound.  Murphy sign negative, no tenderness to palpation over McBurney's point, Rovsings, Psoas and obturator all negative.   Musculoskeletal: Normal range of motion.  Neurological: She is alert and oriented to person, place, and time.  Skin: She is not diaphoretic.  Psychiatric: She has a normal mood and affect.  Nursing note and vitals reviewed.   ED Course  Fecal disimpaction Date/Time: 02/01/2016 2:38 PM Performed by: Monico Blitz Authorized by: Monico Blitz Consent: Verbal consent obtained. Consent given by: patient Patient identity confirmed: verbally with patient Local anesthesia used: no Patient sedated: no Comments: Disimpaction performed, normal rectal tone, normal stool color, there is a moderate amount of formed stool in the rectal vault. Patient could not tolerate procedure and DC'd midway through.   (including critical care time) Labs Review Labs Reviewed - No data to display  Imaging Review No results found. I have personally reviewed and evaluated these images and lab results as part of my medical decision-making.   EKG Interpretation None      MDM   Final diagnoses:  Constipation, unspecified constipation type    Filed Vitals:   02/01/16 0818 02/01/16 1018  BP: 115/78 140/77  Pulse: 83 91  Temp: 98.1 F (36.7 C)   TempSrc: Oral   Resp: 16 17  SpO2: 100% 100%    Tracy Bowen is 40 y.o. female presenting with Constipation likely secondary to narcotic use: Patient was recently treated for influenza-like illness with Hycodan. There may also be a bit of dehydration secondary to the flu. Abdominal exam is nonsurgical. Patient is afebrile, tolerating by mouth's, I doubt this is a SBO. I tried to disimpact the patient with little output. She couldn't really tolerate the  procedure. Patient will be written for Senokot, I've encouraged her to push fluids, eat fresh fruits and vegetables. We have had an extensive discussion of return precautions and patient verbalizes her understanding.  Evaluation does not show pathology that would require ongoing emergent intervention or inpatient treatment. Pt is hemodynamically stable and mentating appropriately. Discussed findings and plan with patient/guardian, who agrees with care plan. All questions answered. Return precautions discussed and outpatient follow up given.   Discharge Medication List as of 02/01/2016  1:27 PM    START taking these medications   Details  senna (SENOKOT) 8.6 MG TABS tablet Take 1 tablet (8.6 mg total) by mouth 2 (two) times daily as needed for mild constipation., Starting 02/01/2016, Until Discontinued, Print             Monico Blitz, PA-C 02/01/16 1440  Leo Grosser, MD 02/01/16 [phone]

## 2016-02-01 NOTE — ED Notes (Signed)
Pt with constipation.  Unsure of last bm.  Pt recently treated with cough med in which she had to "show her ID".  ? Narcotic.  Nausea with no vomiting.  Abdomen distended.

## 2016-03-09 ENCOUNTER — Encounter: Payer: BC Managed Care – PPO | Admitting: Gynecology

## 2016-05-23 ENCOUNTER — Encounter: Payer: BC Managed Care – PPO | Admitting: Gynecology

## 2016-06-07 ENCOUNTER — Encounter: Payer: Self-pay | Admitting: Gynecology

## 2016-06-07 ENCOUNTER — Ambulatory Visit (INDEPENDENT_AMBULATORY_CARE_PROVIDER_SITE_OTHER): Payer: BC Managed Care – PPO | Admitting: Gynecology

## 2016-06-07 VITALS — BP 120/74 | Ht 71.0 in | Wt 273.0 lb

## 2016-06-07 DIAGNOSIS — A609 Anogenital herpesviral infection, unspecified: Secondary | ICD-10-CM

## 2016-06-07 DIAGNOSIS — Z13220 Encounter for screening for lipoid disorders: Secondary | ICD-10-CM

## 2016-06-07 DIAGNOSIS — Z01419 Encounter for gynecological examination (general) (routine) without abnormal findings: Secondary | ICD-10-CM | POA: Diagnosis not present

## 2016-06-07 DIAGNOSIS — N920 Excessive and frequent menstruation with regular cycle: Secondary | ICD-10-CM | POA: Diagnosis not present

## 2016-06-07 LAB — CBC WITH DIFFERENTIAL/PLATELET
BASOS ABS: 0 {cells}/uL (ref 0–200)
Basophils Relative: 0 %
EOS PCT: 3 %
Eosinophils Absolute: 222 cells/uL (ref 15–500)
HEMATOCRIT: 32.1 % — AB (ref 35.0–45.0)
HEMOGLOBIN: 9.6 g/dL — AB (ref 11.7–15.5)
LYMPHS ABS: 2072 {cells}/uL (ref 850–3900)
LYMPHS PCT: 28 %
MCH: 22.1 pg — AB (ref 27.0–33.0)
MCHC: 29.9 g/dL — AB (ref 32.0–36.0)
MCV: 74 fL — ABNORMAL LOW (ref 80.0–100.0)
MONO ABS: 370 {cells}/uL (ref 200–950)
MPV: 9.5 fL (ref 7.5–12.5)
Monocytes Relative: 5 %
NEUTROS PCT: 64 %
Neutro Abs: 4736 cells/uL (ref 1500–7800)
Platelets: 302 10*3/uL (ref 140–400)
RBC: 4.34 MIL/uL (ref 3.80–5.10)
RDW: 16.8 % — AB (ref 11.0–15.0)
WBC: 7.4 10*3/uL (ref 3.8–10.8)

## 2016-06-07 LAB — LIPID PANEL
CHOL/HDL RATIO: 3.1 ratio (ref <=5.0)
Cholesterol: 205 mg/dL — ABNORMAL HIGH (ref 125–200)
HDL: 66 mg/dL (ref >=46)
LDL CALC: 119 mg/dL (ref <130)
TRIGLYCERIDES: 99 mg/dL (ref <150)
VLDL: 20 mg/dL (ref <30)

## 2016-06-07 LAB — COMPREHENSIVE METABOLIC PANEL
ALBUMIN: 3.9 g/dL (ref 3.6–5.1)
ALT: 9 U/L (ref 6–29)
AST: 15 U/L (ref 10–30)
Alkaline Phosphatase: 93 U/L (ref 33–115)
BUN: 7 mg/dL (ref 7–25)
CALCIUM: 9.1 mg/dL (ref 8.6–10.2)
CHLORIDE: 102 mmol/L (ref 98–110)
CO2: 22 mmol/L (ref 20–31)
Creat: 0.94 mg/dL (ref 0.50–1.10)
GLUCOSE: 91 mg/dL (ref 65–99)
POTASSIUM: 3.9 mmol/L (ref 3.5–5.3)
Sodium: 135 mmol/L (ref 135–146)
Total Bilirubin: 0.3 mg/dL (ref 0.2–1.2)
Total Protein: 7.5 g/dL (ref 6.1–8.1)

## 2016-06-07 LAB — TSH: TSH: 1.55 m[IU]/L

## 2016-06-07 NOTE — Patient Instructions (Signed)
Follow up for the ultrasound as scheduled.  You may obtain a copy of any labs that were done today by logging onto MyChart as outlined in the instructions provided with your AVS (after visit summary). The office will not call with normal lab results but certainly if there are any significant abnormalities then we will contact you.   Health Maintenance Adopting a healthy lifestyle and getting preventive care can go a long way to promote health and wellness. Talk with your health care provider about what schedule of regular examinations is right for you. This is a good chance for you to check in with your provider about disease prevention and staying healthy. In between checkups, there are plenty of things you can do on your own. Experts have done a lot of research about which lifestyle changes and preventive measures are most likely to keep you healthy. Ask your health care provider for more information. WEIGHT AND DIET  Eat a healthy diet  Be sure to include plenty of vegetables, fruits, low-fat dairy products, and lean protein.  Do not eat a lot of foods high in solid fats, added sugars, or salt.  Get regular exercise. This is one of the most important things you can do for your health.  Most adults should exercise for at least 150 minutes each week. The exercise should increase your heart rate and make you sweat (moderate-intensity exercise).  Most adults should also do strengthening exercises at least twice a week. This is in addition to the moderate-intensity exercise.  Maintain a healthy weight  Body mass index (BMI) is a measurement that can be used to identify possible weight problems. It estimates body fat based on height and weight. Your health care provider can help determine your BMI and help you achieve or maintain a healthy weight.  For females 44 years of age and older:   A BMI below 18.5 is considered underweight.  A BMI of 18.5 to 24.9 is normal.  A BMI of 25 to 29.9 is  considered overweight.  A BMI of 30 and above is considered obese.  Watch levels of cholesterol and blood lipids  You should start having your blood tested for lipids and cholesterol at 38 years of age, then have this test every 5 years.  You may need to have your cholesterol levels checked more often if:  Your lipid or cholesterol levels are high.  You are older than 38 years of age.  You are at high risk for heart disease.  CANCER SCREENING   Lung Cancer  Lung cancer screening is recommended for adults 86-1 years old who are at high risk for lung cancer because of a history of smoking.  A yearly low-dose CT scan of the lungs is recommended for people who:  Currently smoke.  Have quit within the past 15 years.  Have at least a 30-pack-year history of smoking. A pack year is smoking an average of one pack of cigarettes a day for 1 year.  Yearly screening should continue until it has been 15 years since you quit.  Yearly screening should stop if you develop a health problem that would prevent you from having lung cancer treatment.  Breast Cancer  Practice breast self-awareness. This means understanding how your breasts normally appear and feel.  It also means doing regular breast self-exams. Let your health care provider know about any changes, no matter how small.  If you are in your 20s or 30s, you should have a clinical breast exam (  CBE) by a health care provider every 1-3 years as part of a regular health exam.  If you are 30 or older, have a CBE every year. Also consider having a breast X-ray (mammogram) every year.  If you have a family history of breast cancer, talk to your health care provider about genetic screening.  If you are at high risk for breast cancer, talk to your health care provider about having an MRI and a mammogram every year.  Breast cancer gene (BRCA) assessment is recommended for women who have family members with BRCA-related cancers.  BRCA-related cancers include:  Breast.  Ovarian.  Tubal.  Peritoneal cancers.  Results of the assessment will determine the need for genetic counseling and BRCA1 and BRCA2 testing. Cervical Cancer Routine pelvic examinations to screen for cervical cancer are no longer recommended for nonpregnant women who are considered low risk for cancer of the pelvic organs (ovaries, uterus, and vagina) and who do not have symptoms. A pelvic examination may be necessary if you have symptoms including those associated with pelvic infections. Ask your health care provider if a screening pelvic exam is right for you.   The Pap test is the screening test for cervical cancer for women who are considered at risk.  If you had a hysterectomy for a problem that was not cancer or a condition that could lead to cancer, then you no longer need Pap tests.  If you are older than 65 years, and you have had normal Pap tests for the past 10 years, you no longer need to have Pap tests.  If you have had past treatment for cervical cancer or a condition that could lead to cancer, you need Pap tests and screening for cancer for at least 20 years after your treatment.  If you no longer get a Pap test, assess your risk factors if they change (such as having a new sexual partner). This can affect whether you should start being screened again.  Some women have medical problems that increase their chance of getting cervical cancer. If this is the case for you, your health care provider may recommend more frequent screening and Pap tests.  The human papillomavirus (HPV) test is another test that may be used for cervical cancer screening. The HPV test looks for the virus that can cause cell changes in the cervix. The cells collected during the Pap test can be tested for HPV.  The HPV test can be used to screen women 55 years of age and older. Getting tested for HPV can extend the interval between normal Pap tests from three to  five years.  An HPV test also should be used to screen women of any age who have unclear Pap test results.  After 38 years of age, women should have HPV testing as often as Pap tests.  Colorectal Cancer  This type of cancer can be detected and often prevented.  Routine colorectal cancer screening usually begins at 38 years of age and continues through 38 years of age.  Your health care provider may recommend screening at an earlier age if you have risk factors for colon cancer.  Your health care provider may also recommend using home test kits to check for hidden blood in the stool.  A small camera at the end of a tube can be used to examine your colon directly (sigmoidoscopy or colonoscopy). This is done to check for the earliest forms of colorectal cancer.  Routine screening usually begins at age 20.  Direct examination of the colon should be repeated every 5-10 years through 38 years of age. However, you may need to be screened more often if early forms of precancerous polyps or small growths are found. Skin Cancer  Check your skin from head to toe regularly.  Tell your health care provider about any new moles or changes in moles, especially if there is a change in a mole's shape or color.  Also tell your health care provider if you have a mole that is larger than the size of a pencil eraser.  Always use sunscreen. Apply sunscreen liberally and repeatedly throughout the day.  Protect yourself by wearing long sleeves, pants, a wide-brimmed hat, and sunglasses whenever you are outside. HEART DISEASE, DIABETES, AND HIGH BLOOD PRESSURE   Have your blood pressure checked at least every 1-2 years. High blood pressure causes heart disease and increases the risk of stroke.  If you are between 33 years and 22 years old, ask your health care provider if you should take aspirin to prevent strokes.  Have regular diabetes screenings. This involves taking a blood sample to check your  fasting blood sugar level.  If you are at a normal weight and have a low risk for diabetes, have this test once every three years after 38 years of age.  If you are overweight and have a high risk for diabetes, consider being tested at a younger age or more often. PREVENTING INFECTION  Hepatitis B  If you have a higher risk for hepatitis B, you should be screened for this virus. You are considered at high risk for hepatitis B if:  You were born in a country where hepatitis B is common. Ask your health care provider which countries are considered high risk.  Your parents were born in a high-risk country, and you have not been immunized against hepatitis B (hepatitis B vaccine).  You have HIV or AIDS.  You use needles to inject street drugs.  You live with someone who has hepatitis B.  You have had sex with someone who has hepatitis B.  You get hemodialysis treatment.  You take certain medicines for conditions, including cancer, organ transplantation, and autoimmune conditions. Hepatitis C  Blood testing is recommended for:  Everyone born from [birth year] through 1963.  Anyone with known risk factors for hepatitis C. Sexually transmitted infections (STIs)  You should be screened for sexually transmitted infections (STIs) including gonorrhea and chlamydia if:  You are sexually active and are younger than 38 years of age.  You are older than 38 years of age and your health care provider tells you that you are at risk for this type of infection.  Your sexual activity has changed since you were last screened and you are at an increased risk for chlamydia or gonorrhea. Ask your health care provider if you are at risk.  If you do not have HIV, but are at risk, it may be recommended that you take a prescription medicine daily to prevent HIV infection. This is called pre-exposure prophylaxis (PrEP). You are considered at risk if:  You are sexually active and do not regularly use condoms or  know the HIV status of your partner(s).  You take drugs by injection.  You are sexually active with a partner who has HIV. Talk with your health care provider about whether you are at high risk of being infected with HIV. If you choose to begin PrEP, you should first be tested for HIV. You should then be  tested every 3 months for as long as you are taking PrEP.  PREGNANCY   If you are premenopausal and you may become pregnant, ask your health care provider about preconception counseling.  If you may become pregnant, take 400 to 800 micrograms (mcg) of folic acid every day.  If you want to prevent pregnancy, talk to your health care provider about birth control (contraception). OSTEOPOROSIS AND MENOPAUSE   Osteoporosis is a disease in which the bones lose minerals and strength with aging. This can result in serious bone fractures. Your risk for osteoporosis can be identified using a bone density scan.  If you are 25 years of age or older, or if you are at risk for osteoporosis and fractures, ask your health care provider if you should be screened.  Ask your health care provider whether you should take a calcium or vitamin D supplement to lower your risk for osteoporosis.  Menopause may have certain physical symptoms and risks.  Hormone replacement therapy may reduce some of these symptoms and risks. Talk to your health care provider about whether hormone replacement therapy is right for you.  HOME CARE INSTRUCTIONS   Schedule regular health, dental, and eye exams.  Stay current with your immunizations.   Do not use any tobacco products including cigarettes, chewing tobacco, or electronic cigarettes.  If you are pregnant, do not drink alcohol.  If you are breastfeeding, limit how much and how often you drink alcohol.  Limit alcohol intake to no more than 1 drink per day for nonpregnant women. One drink equals 12 ounces of beer, 5 ounces of wine, or 1 ounces of hard liquor.  Do  not use street drugs.  Do not share needles.  Ask your health care provider for help if you need support or information about quitting drugs.  Tell your health care provider if you often feel depressed.  Tell your health care provider if you have ever been abused or do not feel safe at home. Document Released: 06/11/2011 Document Revised: 04/12/2014 Document Reviewed: 10/28/2013 [facility] Patient Information 2015 [place], [place]. This information is not intended to replace advice given to you by your health care provider. Make sure you discuss any questions you have with your health care provider.

## 2016-06-07 NOTE — Progress Notes (Signed)
    Tracy Bowen 04/26/1976 [id number]        40 y.o.  [id number]  for annual exam.  Several issues noted below.  Past medical history,surgical history, problem list, medications, allergies, family history and social history were all reviewed and documented as reviewed in the EPIC chart.  ROS:  Performed with pertinent positives and negatives included in the history, assessment and plan.   Additional significant findings :  None   Exam: [name] assistant Filed Vitals:   06/07/16 1003  BP: 120/74  Height: 5\' 11"  (1.803 m)  Weight: 273 lb (123.832 kg)   General appearance:  Normal affect, orientation and appearance. Skin: Grossly normal HEENT: Without gross lesions.  No cervical or supraclavicular adenopathy. Thyroid normal.  Lungs:  Clear without wheezing, rales or rhonchi Cardiac: RR, without RMG Abdominal:  Soft, nontender, without masses, guarding, rebound, organomegaly or hernia Breasts:  Examined lying and sitting without masses, retractions, discharge or axillary adenopathy. Pelvic:  Ext/BUS/vagina normal  Cervix normal  Uterus anteverted, normal size, shape and contour, midline and mobile nontender   Adnexa without masses or tenderness    Anus and perineum normal   Rectovaginal normal sphincter tone without palpated masses or tenderness.    Assessment/Plan:  40 y.o. [id number] female for annual exam with regular menses, tubal sterilization.   1. Menorrhagia. Patient's menses are heavy and seem to be getting heavier. Lasting 7 days with double protection and breakthrough episodes. History of anemia in the past. Check CBC today. Schedule sonohysterogram to rule out nonpalpable abnormalities or intracavitary abnormalities. Options for management were reviewed to include hormonal manipulation, Mirena, endometrial ablation, hysterectomy.  patient will follow up after the sonohysterogram to discuss. I did review endometrial ablation in general with her to include the success  rates and failure risks and no guarantees as far as amenorrhea. She is status post tubal sterilization and understands that if she pursues this she should never try to get pregnant in the future. 2. Breast health.  Reviewed most current ACOG breast health recommendations. Breast awareness/exams monthly reviewed. Screening mammographic recommendations discussed and will begin to offer at age 24.Marland Kitchen No family history of breast cancer. 3. Pap smear/HPV 08/2014 negative. No Pap smear done today. No history of significant abnormal Pap smears previously. Plan repeat Pap smear approaching 5 year interval per current screening guidelines. 4. Health maintenance. Has not had routine blood work done recently. Baseline CBC, CMP, lipid profile, TSH and urinalysis ordered. Follow up for sonohysterogram otherwise annual follow up   Anastasio Auerbach MD, 10:44 AM 06/07/2016

## 2016-06-08 LAB — URINALYSIS W MICROSCOPIC + REFLEX CULTURE
BACTERIA UA: NONE SEEN [HPF]
BILIRUBIN URINE: NEGATIVE
CRYSTALS: NONE SEEN [HPF]
Casts: NONE SEEN [LPF]
Glucose, UA: NEGATIVE
HGB URINE DIPSTICK: NEGATIVE
KETONES UR: NEGATIVE
Leukocytes, UA: NEGATIVE
Nitrite: NEGATIVE
PROTEIN: NEGATIVE
Specific Gravity, Urine: 1.026 (ref 1.001–1.035)
Yeast: NONE SEEN [HPF]
pH: 6 (ref 5.0–8.0)

## 2016-06-09 LAB — URINE CULTURE
COLONY COUNT: NO GROWTH
Organism ID, Bacteria: NO GROWTH

## 2016-06-14 ENCOUNTER — Other Ambulatory Visit: Payer: Self-pay | Admitting: Gynecology

## 2016-06-14 DIAGNOSIS — N939 Abnormal uterine and vaginal bleeding, unspecified: Secondary | ICD-10-CM

## 2016-06-27 ENCOUNTER — Telehealth: Payer: Self-pay | Admitting: Gynecology

## 2016-06-27 NOTE — Telephone Encounter (Signed)
06/27/16-Pt was advised today that her [facility] ins covers the sonohysterogram and bx if needed under her $40 copay. Per [id number]

## 2016-07-02 ENCOUNTER — Emergency Department (HOSPITAL_COMMUNITY)
Admission: EM | Admit: 2016-07-02 | Discharge: 2016-07-02 | Disposition: A | Discharge status: Home / Self Care | Payer: BC Managed Care – PPO | Attending: Dermatology | Admitting: Dermatology

## 2016-07-02 ENCOUNTER — Ambulatory Visit (INDEPENDENT_AMBULATORY_CARE_PROVIDER_SITE_OTHER): Payer: BC Managed Care – PPO | Admitting: Family Medicine

## 2016-07-02 ENCOUNTER — Encounter (HOSPITAL_COMMUNITY): Payer: Self-pay | Admitting: Emergency Medicine

## 2016-07-02 VITALS — BP 112/78 | HR 77 | Temp 98.5°F | Ht 71.0 in | Wt 274.0 lb

## 2016-07-02 DIAGNOSIS — F329 Major depressive disorder, single episode, unspecified: Secondary | ICD-10-CM | POA: Insufficient documentation

## 2016-07-02 DIAGNOSIS — Z5321 Procedure and treatment not carried out due to patient leaving prior to being seen by health care provider: Secondary | ICD-10-CM | POA: Insufficient documentation

## 2016-07-02 DIAGNOSIS — T50995A Adverse effect of other drugs, medicaments and biological substances, initial encounter: Secondary | ICD-10-CM | POA: Diagnosis not present

## 2016-07-02 DIAGNOSIS — L299 Pruritus, unspecified: Secondary | ICD-10-CM | POA: Diagnosis not present

## 2016-07-02 DIAGNOSIS — T7840XA Allergy, unspecified, initial encounter: Secondary | ICD-10-CM | POA: Diagnosis not present

## 2016-07-02 DIAGNOSIS — R220 Localized swelling, mass and lump, head: Secondary | ICD-10-CM | POA: Diagnosis not present

## 2016-07-02 MED ORDER — FLUOCINOLONE ACETONIDE 0.01 % EX SOLN: Freq: Two times a day (BID) | CUTANEOUS | 0 refills | Status: DC

## 2016-07-02 MED ORDER — PREDNISONE 20 MG PO TABS: ORAL_TABLET | ORAL | 0 refills | Status: DC

## 2016-07-02 NOTE — Patient Instructions (Addendum)
Take the prednisone 3 initially today when you get them, then tomorrow morning after breakfast take 3, then 2 pills daily for 2 days, then 1 pill daily for 2 days. Best taken after breakfast.  Use the fluocinolone solution as directed in the scalp twice a day for the next few days until the itching is calming down  Continue taking Zyrtec daily  Return if worse  Advise going by the hair salon and finding out exactly what ingredients were in the hair dye that you used. You should be careful to avoid these in the future. If any point you find it necessary or desired rest make the referral, we can refer you to an allergist. I do not believe it is necessary at this time to be tested.      IF you received an x-ray today, you will receive an invoice from [facility]. Please contact [facility] at [phone] with questions or concerns regarding your invoice.   IF you received labwork today, you will receive an invoice from [employer]. Please contact [facility] at [phone] with questions or concerns regarding your invoice.   Our billing staff will not be able to assist you with questions regarding bills from these companies.  You will be contacted with the lab results as soon as they are available. The fastest way to get your results is to activate your My Chart account. Instructions are located on the last page of this paperwork. If you have not heard from [place] regarding the results in 2 weeks, please contact this office.

## 2016-07-02 NOTE — ED Notes (Signed)
Pt advised registration that she was leaving and no longer wanted to wait to be seen; pt walked out of ED lobby

## 2016-07-02 NOTE — Progress Notes (Signed)
Patient ID: Tracy Bowen, female    DOB: 08/31/1976  Age: 40 y.o. MRN: [id number]  Chief Complaint  Patient presents with  . Allergic Reaction    face swelling possibly to hair dye    Subjective:   40 year old lady who had her hair dyed 3 days ago. Saturday she developed itching scalp and Sunday she had swelling of the left side of the face and scalp and more itching. She is taking Zyrtec regularly for allergies. She took some Benadryl. Needs to itch.   Current allergies, medications, problem list, past/family and social histories reviewed.  Objective:  BP 112/78 (BP Location: Right Arm)   Pulse 77   Temp 98.5 F (36.9 C) (Oral)   Ht 5\' 11"  (1.803 m)   Wt 274 lb (124.3 kg)   LMP 06/08/2016 (Approximate)   SpO2 98%   BMI 38.22 kg/m   Mild swelling of the left temporal area of her face. Orbits are fairly spared. The left side of her scalp itches more than the right. There are some bumps on her forehead but no frank blistering.   Assessment & Plan:   Assessment: 1. Dye allergic reaction, initial encounter   2. Swelling of left side of face   3. Itching       Plan: See instructions.   No orders of the defined types were placed in this encounter.   Meds ordered this encounter  Medications  . predniSONE (DELTASONE) 20 MG tablet    Sig: Take 3 daily for 2 days, then 2 daily for 2 days, then 1 daily for 2 days for allergic reaction    Dispense:  12 tablet    Refill:  0  . fluocinolone (SYNALAR) 0.01 % external solution    Sig: Apply topically 2 (two) times daily.    Dispense:  60 mL    Refill:  0         Patient Instructions   Take the prednisone 3 initially today when you get them, then tomorrow morning after breakfast take 3, then 2 pills daily for 2 days, then 1 pill daily for 2 days. Best taken after breakfast.  Use the fluocinolone solution as directed in the scalp twice a day for the next few days until the itching is calming down  Continue taking  Zyrtec daily  Return if worse  Advise going by the hair salon and finding out exactly what ingredients were in the hair dye that you used. You should be careful to avoid these in the future. If any point you find it necessary or desired rest make the referral, we can refer you to an allergist. I do not believe it is necessary at this time to be tested.      IF you received an x-ray today, you will receive an invoice from [facility]. Please contact [facility] at [phone] with questions or concerns regarding your invoice.   IF you received labwork today, you will receive an invoice from [employer]. Please contact [facility] at [phone] with questions or concerns regarding your invoice.   Our billing staff will not be able to assist you with questions regarding bills from these companies.  You will be contacted with the lab results as soon as they are available. The fastest way to get your results is to activate your My Chart account. Instructions are located on the last page of this paperwork. If you have not heard from [place] regarding the results in 2 weeks, please contact  this office.        Return if symptoms worsen or fail to improve.   Derrek Puff, MD 07/02/2016

## 2016-07-02 NOTE — ED Triage Notes (Signed)
Pt states that on Friday night she dyed her hair and she is having itching on her head and eyes. Took benadryl at home. Alert and oriented.

## 2016-07-04 ENCOUNTER — Ambulatory Visit (INDEPENDENT_AMBULATORY_CARE_PROVIDER_SITE_OTHER): Payer: BC Managed Care – PPO

## 2016-07-04 ENCOUNTER — Other Ambulatory Visit: Payer: Self-pay | Admitting: Gynecology

## 2016-07-04 ENCOUNTER — Encounter: Payer: Self-pay | Admitting: Gynecology

## 2016-07-04 ENCOUNTER — Ambulatory Visit (INDEPENDENT_AMBULATORY_CARE_PROVIDER_SITE_OTHER): Payer: BC Managed Care – PPO | Admitting: Gynecology

## 2016-07-04 VITALS — BP 118/76

## 2016-07-04 DIAGNOSIS — N8312 Corpus luteum cyst of left ovary: Secondary | ICD-10-CM

## 2016-07-04 DIAGNOSIS — D251 Intramural leiomyoma of uterus: Secondary | ICD-10-CM

## 2016-07-04 DIAGNOSIS — D500 Iron deficiency anemia secondary to blood loss (chronic): Secondary | ICD-10-CM | POA: Diagnosis not present

## 2016-07-04 DIAGNOSIS — N920 Excessive and frequent menstruation with regular cycle: Secondary | ICD-10-CM

## 2016-07-04 DIAGNOSIS — N939 Abnormal uterine and vaginal bleeding, unspecified: Secondary | ICD-10-CM | POA: Diagnosis not present

## 2016-07-04 NOTE — Progress Notes (Addendum)
    Tracy Bowen [birth year] [id number]        40 y.o.  [id number] presents for sonohysterogram due to menorrhagia. Hemoglobin did return 9 and she is on daily over-the-counter iron supplement.  Past medical history,surgical history, problem list, medications, allergies, family history and social history were all reviewed and documented in the EPIC chart.  Directed ROS with pertinent positives and negatives documented in the history of present illness/assessment and plan.  Exam: [place] assistant Vitals:   07/04/16 1248  BP: 118/76   General appearance:  Normal Abdomen soft nontender without masses guarding rebound Pelvic external BUS vagina normal. Cervix normal. Uterus deep in the pelvis but palpates grossly normal. Adnexa without masses or tenderness.  Ultrasound shows uterus enlarged with multiple myomas ranging from 62 mm, 49 mm, 33 mm, 30 mm, 26 mm, 20 mm. Endometrial echo 12.6 mm. Right and left ovaries grossly normal. Classic corpus luteal cyst noted on left ovary. Cul-de-sac negative.  Sonohysterogram performed, sterile technique, easy catheter introduction, good distention with myoma encroaching the cavity approximately 40% within cavity. Smaller area questionable endometrial polyp. Endometrial biopsy taken. Patient tolerated well.    Assessment/Plan:  40 y.o. [id number] with menorrhagia and multiple myomas. Exam is deceptive because uterus does not feel as large as it is on ultrasound. It is high in the pelvis. Questionable endometrial polyp. An endometrial sample taken. Patient will follow up for results.  Options for management to include observation, hysteroscopy with resection of intracavitary defects to include polyp/leiomyoma the protrude into the cavity. She understands though that we will not remove the other myomas and that we may not be able to remove all of the myoma within the cavity. Possible endometrial ablation, multiple myomectomy either attempted robotically  versus open and ultimately possible hysterectomy. I think given the size and high in the pelvis a TAH would be most reasonable as I feel it would be difficult laparoscopically. Patient will think about her options and we will further discuss after the biopsy results return.    Anastasio Auerbach MD, 12:59 PM 07/04/2016

## 2016-07-04 NOTE — Patient Instructions (Signed)
Office will call you with biopsy results.  We can rediscuss treatment options.

## 2016-07-05 ENCOUNTER — Telehealth: Payer: Self-pay

## 2016-07-05 NOTE — Telephone Encounter (Signed)
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   I read your note. Please advise.

## 2016-07-05 NOTE — Telephone Encounter (Signed)
Will place a surgical request slip. If she is looking to accelerate her surgical timing she does have a lower hemoglobin and would be at a higher risk of transfusion would have to accept this if we move quickly to schedule her surgery. She should be on daily iron I would increase that to twice daily if she can tolerate this. 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

## 2016-07-05 NOTE — Telephone Encounter (Signed)
I called patient back and left message in voice mail to call me. I did tell her first available block time is 8/15 7:30am.  I told her I had checked ins benefits as well and could discuss surgery prepymt due, etc with her. Also, I have recommendation/advice for scheduling from Dr. Loetta Rough to share with her.

## 2016-07-06 NOTE — Telephone Encounter (Signed)
Order faxed. Patient knows to expect call from Company to verify ins benefits and her cost and she will give okay for shipping Rx to [place].  I made a note on her surgery order and when I speak with her next to schedule I will verify that she is okay with transfusion in the event it was necessary.

## 2016-07-06 NOTE — Telephone Encounter (Signed)
Okay for Depo-Lupron 11.25 mg IM to administer with menses.  Also verify that she would be okay with transfusion in the event it was necessary

## 2016-07-06 NOTE — Telephone Encounter (Signed)
Pt. Called back. She actually cannot work out surgery for August and is fine with the idea of waiting until around Christmas break and doing surgery then. She would like to proceed with Lupron.  Please send me an order for it. Thanks

## 2016-07-20 ENCOUNTER — Telehealth: Payer: Self-pay | Admitting: *Deleted

## 2016-07-20 MED ORDER — VALACYCLOVIR HCL 500 MG PO TABS: ORAL_TABLET | ORAL | 0 refills | Status: DC

## 2016-07-20 MED ORDER — CLINDAMYCIN PHOSPHATE 2 % VA CREA: 1.0000 | TOPICAL_CREAM | Freq: Every day | VAGINAL | 0 refills | Status: DC

## 2016-07-20 NOTE — Telephone Encounter (Signed)
Pt aware, Rx sent. 

## 2016-07-20 NOTE — Telephone Encounter (Signed)
I left message for pt to call to find out what pharmacy she would like Rx sent. She also would like refill on valtrex. I will wait to hear from pt.

## 2016-07-20 NOTE — Telephone Encounter (Signed)
Pt is out of town on vacation will be back in town next Thursday c/o BV infection fishy odor, asked if you could please see a Rx for BV medication? Please advise

## 2016-07-20 NOTE — Telephone Encounter (Signed)
2 choices. Flagyl 500 mg twice a day 7 days but she needs to avoid alcohol. Alternative would be Cleocin vaginal cream nightly 7 nights for which she can do what ever.

## 2016-07-26 ENCOUNTER — Telehealth: Payer: Self-pay

## 2016-07-26 NOTE — Telephone Encounter (Signed)
I called patient and let her know her Lupron just arrived. I scheduled her a nurse appointment to come by in the morning for injection.

## 2016-07-27 ENCOUNTER — Ambulatory Visit (INDEPENDENT_AMBULATORY_CARE_PROVIDER_SITE_OTHER): Payer: BC Managed Care – PPO | Admitting: Gynecology

## 2016-07-27 DIAGNOSIS — D259 Leiomyoma of uterus, unspecified: Secondary | ICD-10-CM | POA: Diagnosis not present

## 2016-07-27 MED ORDER — LEUPROLIDE ACETATE (3 MONTH) 11.25 MG IM KIT
11.2500 mg | PACK | Freq: Once | INTRAMUSCULAR | Status: AC
  Administered 2016-07-27: 11.25 mg via INTRAMUSCULAR

## 2016-08-08 ENCOUNTER — Telehealth: Payer: Self-pay | Admitting: *Deleted

## 2016-08-08 NOTE — Telephone Encounter (Signed)
Pt had Lupron injection on 07/27/16 started bleeding on started 08/04/16 was heavy at the time x 2 days, using super pad every 2 hours,bleeding has now slowed down changing every 3 hours, asked if she continue to watch bleeding flow for now and hopes it will stop? Recommendations? Please advise

## 2016-08-09 ENCOUNTER — Telehealth: Payer: Self-pay

## 2016-08-09 NOTE — Telephone Encounter (Signed)
Patient called. 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  I will call her when I receive Dec schedule to coordinate.  Dr. Tennis Ship had Lupron 11.25 on 07/27/16.  12 weeks from that date is November 10th.  If she wants to wait until Christmas break will you want her to have another Lupron injection. 3.75mg ?

## 2016-08-09 NOTE — Telephone Encounter (Signed)
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

## 2016-08-09 NOTE — Telephone Encounter (Signed)
Not unusual to have what they refer to as a flare as a surge of estrogen which can lead to heavy bleeding episode. I would watch for now and I suspect it is going to get better and go away and she'll have no further bleeding.

## 2016-08-09 NOTE — Telephone Encounter (Signed)
Yes to another injection of Depo-Lupron. 3.75 would be fine to carry her for one month.

## 2016-08-15 ENCOUNTER — Telehealth: Payer: Self-pay

## 2016-08-15 ENCOUNTER — Other Ambulatory Visit: Payer: Self-pay | Admitting: Gynecology

## 2016-08-15 MED ORDER — MEGESTROL ACETATE 20 MG PO TABS: 20.0000 mg | ORAL_TABLET | Freq: Every day | ORAL | 0 refills | Status: DC

## 2016-08-15 NOTE — Telephone Encounter (Signed)
Patient informed. Rx sent 

## 2016-08-15 NOTE — Telephone Encounter (Signed)
I called and discussed with patient that her Lupron 11. 25 mg. injection will be good through 10/19/16 and if she would like to wait until Christmas vacation for surgery then Dr. Loetta Rough recommended one more 3.75mg  Lupron injection to carry her for one more month. She is fine with that and I will start the process.  Dr. Tennis Ship said she had Lupron injection 07/27/16 and started bleeding on 08/06/16 and continues bleeding today.  What to rec?

## 2016-08-15 NOTE — Telephone Encounter (Signed)
Trial of Megace 20 mg daily 10 days

## 2016-09-03 ENCOUNTER — Telehealth: Payer: Self-pay

## 2016-09-03 NOTE — Telephone Encounter (Signed)
Patient received Lupron injection 07/27/16. She bled for a few weeks after that. Now 08/24/16 and she has started bleeding again. Heavy with cramps.  What to rec?

## 2016-09-04 MED ORDER — MEGESTROL ACETATE 20 MG PO TABS: 20.0000 mg | ORAL_TABLET | Freq: Two times a day (BID) | ORAL | 0 refills | Status: DC

## 2016-09-04 NOTE — Telephone Encounter (Signed)
Left message for patient on voice mail at her request. Rx sent.

## 2016-09-04 NOTE — Telephone Encounter (Signed)
Megace 20 mg BID for one week

## 2016-09-05 ENCOUNTER — Telehealth: Payer: Self-pay

## 2016-09-05 NOTE — Telephone Encounter (Signed)
I called patient and left message that I received note that Lupron is ready but they need her verbal consent to be able to ship it.  I provided the phone number for her to call. I will call her when I receive it.

## 2016-09-11 ENCOUNTER — Telehealth: Payer: Self-pay

## 2016-09-11 NOTE — Telephone Encounter (Signed)
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    Patient has decided she wants to schedule in November. She wants Nov 7th and I scheduled her for this date.  We had arranged a second dose of Lupron 3.75mg  to carry her until Christmas date and she said she did call them this week and give consent to ship it.  She was not concerned about that as "I only paid $10 for it".  I will call and see if I can cancel the order.  Also, Dr. Loetta Rough asked me to check with patient. Patient is fine with blood transfusion if necessary.   Front desk will contact her to schedule pre op appt.

## 2016-09-11 NOTE — Telephone Encounter (Signed)
I called [facility] and asked them (spoke with Chrys Racer) not to ship the Lupron 3.75 mg. On 09/13/16 as planned. I explained surgery is now scheduled for 10/16/16 and we will not need it. She cancelled the order but said if anything changed and we need it we just have to call them and let them know to reorder it [phone]

## 2016-09-21 DIAGNOSIS — Z0289 Encounter for other administrative examinations: Secondary | ICD-10-CM

## 2016-09-24 ENCOUNTER — Encounter: Payer: Self-pay | Admitting: Gynecology

## 2016-10-11 NOTE — Patient Instructions (Signed)
Your procedure is scheduled on:  Tuesday, Nov. 7, 2017  Enter through the [employer] [facility] at:  9:00 AM  Pick up the phone at the desk and dial [phone].  Call this number if you have problems the morning of surgery: [phone].  Remember: Do NOT eat food or drink after:  Midnight Monday  Take these medicines the morning of surgery with a SIP OF WATER:  None  Stop ALL herbal medications at this time   Do NOT wear jewelry (body piercing), metal hair clips/bobby pins, make-up, or nail polish. Do NOT wear lotions, powders, or perfumes.  You may wear deodorant. Do NOT shave for 48 hours prior to surgery. Do NOT bring valuables to the hospital. Contacts, dentures, or bridgework may not be worn into surgery.  Leave suitcase in car.  After surgery it may be brought to your room.  For patients admitted to the hospital, checkout time is 11:00 AM the day of discharge.

## 2016-10-12 ENCOUNTER — Encounter (HOSPITAL_COMMUNITY): Payer: Self-pay

## 2016-10-12 ENCOUNTER — Encounter (HOSPITAL_COMMUNITY)
Admission: RE | Admit: 2016-10-12 | Discharge: 2016-10-12 | Disposition: A | Discharge status: Home / Self Care | Payer: BC Managed Care – PPO | Source: Ambulatory Visit | Attending: Gynecology | Admitting: Gynecology

## 2016-10-12 ENCOUNTER — Ambulatory Visit (INDEPENDENT_AMBULATORY_CARE_PROVIDER_SITE_OTHER): Payer: BC Managed Care – PPO | Admitting: Gynecology

## 2016-10-12 ENCOUNTER — Encounter: Payer: Self-pay | Admitting: Gynecology

## 2016-10-12 VITALS — BP 120/74

## 2016-10-12 DIAGNOSIS — D649 Anemia, unspecified: Secondary | ICD-10-CM | POA: Diagnosis not present

## 2016-10-12 DIAGNOSIS — N920 Excessive and frequent menstruation with regular cycle: Secondary | ICD-10-CM

## 2016-10-12 DIAGNOSIS — Z01812 Encounter for preprocedural laboratory examination: Secondary | ICD-10-CM | POA: Diagnosis present

## 2016-10-12 DIAGNOSIS — D259 Leiomyoma of uterus, unspecified: Secondary | ICD-10-CM | POA: Diagnosis not present

## 2016-10-12 DIAGNOSIS — D251 Intramural leiomyoma of uterus: Secondary | ICD-10-CM

## 2016-10-12 HISTORY — DX: Unspecified osteoarthritis, unspecified site: M19.90

## 2016-10-12 HISTORY — DX: Headache: R51

## 2016-10-12 HISTORY — DX: Personal history of urinary calculi: Z87.442

## 2016-10-12 HISTORY — DX: Headache, unspecified: R51.9

## 2016-10-12 HISTORY — DX: Anemia, unspecified: D64.9

## 2016-10-12 LAB — CBC
HEMATOCRIT: 37 % (ref 36.0–46.0)
Hemoglobin: 12.2 g/dL (ref 12.0–15.0)
MCH: 27.4 pg (ref 26.0–34.0)
MCHC: 33 g/dL (ref 30.0–36.0)
MCV: 83.1 fL (ref 78.0–100.0)
Platelets: 221 10*3/uL (ref 150–400)
RBC: 4.45 MIL/uL (ref 3.87–5.11)
RDW: 14.5 % (ref 11.5–15.5)
WBC: 6.9 10*3/uL (ref 4.0–10.5)

## 2016-10-12 NOTE — H&P (Signed)
Tracy Bowen [birth year] [id number]   History and Physical  Chief complaint: Menorrhagia, leiomyoma, anemia  History of present illness: 40 y.o. [id number] with history of worsening menorrhagia with menses lasting 7 days, double protection with bleedthrough episodes.  Hemoglobin 9.  Sonohysterogram with multiple myomas entering 62 mm, 49 mm, 33 mm, 30 mm, 26 mm, 20 mm. Some distortion of the cavity by her myomas. Questionable area suggesting endometrial polyp.  Endometrial biopsy showed secretory endometrium. Patient was placed on Depo-Lupron for menstrual suppression and hemoglobin recovery with most recent hemoglobin 12. Options for management reviewed to include hysteroscopy with resection of any intracavitary defects, hormonal manipulation, endometrial ablation, uterine artery embolization, multiple myomectomy, hysterectomy. Patient elects for hysterectomy and risk reductive bilateral salpingectomies. Due to the bulk of her uterus and support high in the pelvis felt most prudent to proceed with TAH versus attempted laparoscopic/robotic. Patient is in agreement with this.  Past medical history,surgical history, medications, allergies, family history and social history were all reviewed and documented in the EPIC chart.  ROS:  Was performed and pertinent positives and negatives are included in the history of present illness.  Exam:  [name] assistant 10/12/2016 Vitals:   10/12/16 1533  BP: 120/74   General: well developed, well nourished female, no acute distress HEENT: normal  Lungs: clear to auscultation without wheezing, rales or rhonchi  Cardiac: regular rate without rubs, murmurs or gallops  Abdomen: soft, nontender without masses, guarding, rebound, organomegaly  Pelvic: external bus vagina: normal   Cervix: grossly normal  Uterus: Bulky high within the pelvis. Adnexa: without masses or tenderness    Assessment/Plan:  40 y.o. [id number] with history as above for TAH bilateral  salpingectomies. I reviewed the proposed surgery with her. The absolute and irreversible sterility associated with hysterectomy was discussed understood and accepted. Sexuality following hysterectomy was also reviewed in the potential for persistent dyspareunia orgasmic dysfunction was also discussed. The ovarian conservation issue was also reviewed. Due to her young age and no strong family history of breast or ovarian cancer the patient prefers to keep both ovaries for ongoing hormone production. By doing so she understands that she is at risk for ovarian disease to include ovarian cancer in the future. She does give me permission to remove one or both ovaries if it is my intraoperative best recommendation if significant disease is encountered. We also plan on bilateral salpingectomies as a risk reductive surgery for "ovarian cancer". The expected intraoperative and postoperative courses as well as the recovery period were reviewed. The risks of infection, prolonged antibiotics, reoperation for abscess or hematoma formation was discussed. The risks of hemorrhage necessitating transfusion and the risks of transfusion reaction, hepatitis, HIV, mad cow disease and other unknown entities was also discussed. Incisional complications to include opening and draining of incisions and closure by secondary intention, dehiscence and long-term issues of keloid/cosmetics and hernia formation were reviewed. The risk of inadvertent injury to internal organs including bowel, bladder, ureters, vessels, nerves either immediately recognized or delay recognized necessitating major exploratory reparative surgeries and future reparative surgeries including bowel resection, ostomy formation, bladder repair, ureteral damage repair was discussed with her. The patient's questions were answered to her satisfaction and she is ready to proceed with surgery.    Anastasio Auerbach MD, 3:57 PM  10/12/2016

## 2016-10-12 NOTE — Patient Instructions (Signed)
Followup for surgery as scheduled. 

## 2016-10-12 NOTE — Progress Notes (Signed)
Tracy Bowen Oct 04, 1976 [id number]   Preoperative consult  Chief complaint: Menorrhagia, leiomyoma, anemia  History of present illness: 40 y.o. [id number] with history of worsening menorrhagia with menses lasting 7 days, double protection with bleedthrough episodes.  Hemoglobin 9.  Sonohysterogram with multiple myomas entering 62 mm, 49 mm, 33 mm, 30 mm, 26 mm, 20 mm. Some distortion of the cavity by her myomas. Questionable area suggesting endometrial polyp.  Endometrial biopsy showed secretory endometrium. Patient was placed on Depo-Lupron for menstrual suppression and hemoglobin recovery with most recent hemoglobin 12. Options for management reviewed to include hysteroscopy with resection of any intracavitary defects, hormonal manipulation, endometrial ablation, uterine artery embolization, multiple myomectomy, hysterectomy. Patient elects for hysterectomy and risk reductive bilateral salpingectomies. Due to the bulk of her uterus and support high in the pelvis felt most prudent to proceed with TAH versus attempted laparoscopic/robotic. Patient is in agreement with this.  Past medical history,surgical history, medications, allergies, family history and social history were all reviewed and documented in the EPIC chart.  ROS:  Was performed and pertinent positives and negatives are included in the history of present illness.  Exam:  [name] assistant Vitals:   10/12/16 1533  BP: 120/74   General: well developed, well nourished female, no acute distress HEENT: normal  Lungs: clear to auscultation without wheezing, rales or rhonchi  Cardiac: regular rate without rubs, murmurs or gallops  Abdomen: soft, nontender without masses, guarding, rebound, organomegaly  Pelvic: external bus vagina: normal   Cervix: grossly normal  Uterus: Bulky high within the pelvis. Adnexa: without masses or tenderness    Assessment/Plan:  40 y.o. [id number] with history as above for TAH bilateral  salpingectomies. I reviewed the proposed surgery with her. The absolute and irreversible sterility associated with hysterectomy was discussed understood and accepted. Sexuality following hysterectomy was also reviewed in the potential for persistent dyspareunia orgasmic dysfunction was also discussed. The ovarian conservation issue was also reviewed. Due to her young age and no strong family history of breast or ovarian cancer the patient prefers to keep both ovaries for ongoing hormone production. By doing so she understands that she is at risk for ovarian disease to include ovarian cancer in the future. She does give me permission to remove one or both ovaries if it is my intraoperative best recommendation if significant disease is encountered. We also plan on bilateral salpingectomies as a risk reductive surgery for "ovarian cancer". The expected intraoperative and postoperative courses as well as the recovery period were reviewed. The risks of infection, prolonged antibiotics, reoperation for abscess or hematoma formation was discussed. The risks of hemorrhage necessitating transfusion and the risks of transfusion reaction, hepatitis, HIV, mad cow disease and other unknown entities was also discussed. Incisional complications to include opening and draining of incisions and closure by secondary intention, dehiscence and long-term issues of keloid/cosmetics and hernia formation were reviewed. The risk of inadvertent injury to internal organs including bowel, bladder, ureters, vessels, nerves either immediately recognized or delay recognized necessitating major exploratory reparative surgeries and future reparative surgeries including bowel resection, ostomy formation, bladder repair, ureteral damage repair was discussed with her. The patient's questions were answered to her satisfaction and she is ready to proceed with surgery.    Anastasio Auerbach MD, 3:48 PM 10/12/2016

## 2016-10-15 MED ORDER — DEXTROSE 5 % IV SOLN
2.0000 g | INTRAVENOUS | Status: AC
  Administered 2016-10-16: 2 g via INTRAVENOUS
  Filled 2016-10-15: qty 2

## 2016-10-16 ENCOUNTER — Inpatient Hospital Stay (HOSPITAL_COMMUNITY)
Admission: RE | Admit: 2016-10-16 | Discharge: 2016-10-18 | DRG: 743 | Disposition: A | Discharge status: Home / Self Care | Payer: BC Managed Care – PPO | Source: Ambulatory Visit | Attending: Gynecology | Admitting: Gynecology

## 2016-10-16 ENCOUNTER — Inpatient Hospital Stay (HOSPITAL_COMMUNITY): Payer: BC Managed Care – PPO | Admitting: Anesthesiology

## 2016-10-16 ENCOUNTER — Encounter (HOSPITAL_COMMUNITY)
Admission: RE | Disposition: A | Discharge status: Home / Self Care | Payer: Self-pay | Source: Ambulatory Visit | Attending: Gynecology

## 2016-10-16 ENCOUNTER — Encounter (HOSPITAL_COMMUNITY): Payer: Self-pay | Admitting: *Deleted

## 2016-10-16 DIAGNOSIS — N840 Polyp of corpus uteri: Secondary | ICD-10-CM | POA: Diagnosis not present

## 2016-10-16 DIAGNOSIS — D500 Iron deficiency anemia secondary to blood loss (chronic): Secondary | ICD-10-CM | POA: Diagnosis present

## 2016-10-16 DIAGNOSIS — N920 Excessive and frequent menstruation with regular cycle: Secondary | ICD-10-CM | POA: Diagnosis not present

## 2016-10-16 DIAGNOSIS — D259 Leiomyoma of uterus, unspecified: Secondary | ICD-10-CM | POA: Diagnosis not present

## 2016-10-16 DIAGNOSIS — D252 Subserosal leiomyoma of uterus: Secondary | ICD-10-CM | POA: Diagnosis present

## 2016-10-16 DIAGNOSIS — K219 Gastro-esophageal reflux disease without esophagitis: Secondary | ICD-10-CM | POA: Diagnosis present

## 2016-10-16 DIAGNOSIS — D219 Benign neoplasm of connective and other soft tissue, unspecified: Secondary | ICD-10-CM

## 2016-10-16 DIAGNOSIS — D251 Intramural leiomyoma of uterus: Principal | ICD-10-CM | POA: Diagnosis present

## 2016-10-16 HISTORY — PX: BILATERAL SALPINGECTOMY: SHX5743

## 2016-10-16 HISTORY — PX: ABDOMINAL HYSTERECTOMY: SHX81

## 2016-10-16 LAB — PREGNANCY, URINE: Preg Test, Ur: NEGATIVE

## 2016-10-16 SURGERY — HYSTERECTOMY, ABDOMINAL
Anesthesia: General

## 2016-10-16 MED ORDER — BUPIVACAINE LIPOSOME 1.3 % IJ SUSP
20.0000 mL | Freq: Once | INTRAMUSCULAR | Status: AC
  Administered 2016-10-16: 20 mL
  Filled 2016-10-16: qty 20

## 2016-10-16 MED ORDER — LIDOCAINE HCL (CARDIAC) 20 MG/ML IV SOLN
INTRAVENOUS | Status: DC | PRN
  Administered 2016-10-16: 30 mg via INTRAVENOUS
  Administered 2016-10-16: 70 mg via INTRAVENOUS

## 2016-10-16 MED ORDER — HYDROMORPHONE HCL 1 MG/ML IJ SOLN
INTRAMUSCULAR | Status: AC
  Filled 2016-10-16: qty 1

## 2016-10-16 MED ORDER — SCOPOLAMINE 1 MG/3DAYS TD PT72
1.0000 | MEDICATED_PATCH | Freq: Once | TRANSDERMAL | Status: DC
  Administered 2016-10-16: 1.5 mg via TRANSDERMAL

## 2016-10-16 MED ORDER — ONDANSETRON HCL 4 MG/2ML IJ SOLN
INTRAMUSCULAR | Status: AC
  Filled 2016-10-16: qty 2

## 2016-10-16 MED ORDER — HYDROMORPHONE HCL 1 MG/ML IJ SOLN
INTRAMUSCULAR | Status: DC | PRN
  Administered 2016-10-16 (×2): 1 mg via INTRAVENOUS

## 2016-10-16 MED ORDER — PROPOFOL 10 MG/ML IV BOLUS
INTRAVENOUS | Status: DC | PRN
  Administered 2016-10-16: 180 mg via INTRAVENOUS

## 2016-10-16 MED ORDER — MIDAZOLAM HCL 5 MG/5ML IJ SOLN
INTRAMUSCULAR | Status: DC | PRN
  Administered 2016-10-16: 2 mg via INTRAVENOUS

## 2016-10-16 MED ORDER — SUGAMMADEX SODIUM 200 MG/2ML IV SOLN
INTRAVENOUS | Status: DC | PRN
  Administered 2016-10-16: 200 mg via INTRAVENOUS

## 2016-10-16 MED ORDER — FENTANYL CITRATE (PF) 100 MCG/2ML IJ SOLN
INTRAMUSCULAR | Status: DC | PRN
  Administered 2016-10-16 (×2): 50 ug via INTRAVENOUS
  Administered 2016-10-16 (×2): 100 ug via INTRAVENOUS
  Administered 2016-10-16: 50 ug via INTRAVENOUS

## 2016-10-16 MED ORDER — LACTATED RINGERS IV SOLN
INTRAVENOUS | Status: DC
Start: 2016-10-16 — End: 2016-10-16
  Administered 2016-10-16 (×3): via INTRAVENOUS

## 2016-10-16 MED ORDER — KETOROLAC TROMETHAMINE 30 MG/ML IJ SOLN: 30.0000 mg | Freq: Four times a day (QID) | INTRAMUSCULAR | Status: DC

## 2016-10-16 MED ORDER — HYDROMORPHONE HCL 1 MG/ML IJ SOLN
0.2500 mg | INTRAMUSCULAR | Status: DC | PRN
  Administered 2016-10-16 (×2): 0.5 mg via INTRAVENOUS

## 2016-10-16 MED ORDER — PROMETHAZINE HCL 25 MG/ML IJ SOLN: 6.2500 mg | INTRAMUSCULAR | Status: DC | PRN

## 2016-10-16 MED ORDER — DIPHENHYDRAMINE HCL 50 MG/ML IJ SOLN: 12.5000 mg | Freq: Four times a day (QID) | INTRAMUSCULAR | Status: DC | PRN

## 2016-10-16 MED ORDER — LIDOCAINE HCL (CARDIAC) 20 MG/ML IV SOLN
INTRAVENOUS | Status: AC
  Filled 2016-10-16: qty 5

## 2016-10-16 MED ORDER — ONDANSETRON HCL 4 MG/2ML IJ SOLN: 4.0000 mg | Freq: Four times a day (QID) | INTRAMUSCULAR | Status: DC | PRN

## 2016-10-16 MED ORDER — INFLUENZA VAC SPLIT QUAD 0.5 ML IM SUSY
0.5000 mL | PREFILLED_SYRINGE | INTRAMUSCULAR | Status: AC
  Administered 2016-10-18: 0.5 mL via INTRAMUSCULAR

## 2016-10-16 MED ORDER — ONDANSETRON HCL 4 MG/2ML IJ SOLN
INTRAMUSCULAR | Status: DC | PRN
  Administered 2016-10-16: 4 mg via INTRAVENOUS

## 2016-10-16 MED ORDER — SCOPOLAMINE 1 MG/3DAYS TD PT72
MEDICATED_PATCH | TRANSDERMAL | Status: AC
  Filled 2016-10-16: qty 1

## 2016-10-16 MED ORDER — DEXAMETHASONE SODIUM PHOSPHATE 10 MG/ML IJ SOLN
INTRAMUSCULAR | Status: DC | PRN
  Administered 2016-10-16: 10 mg via INTRAVENOUS

## 2016-10-16 MED ORDER — SODIUM CHLORIDE 0.9 % IJ SOLN
INTRAMUSCULAR | Status: AC
  Filled 2016-10-16: qty 50

## 2016-10-16 MED ORDER — SODIUM CHLORIDE 0.9% FLUSH: 9.0000 mL | INTRAVENOUS | Status: DC | PRN

## 2016-10-16 MED ORDER — MIDAZOLAM HCL 2 MG/2ML IJ SOLN
INTRAMUSCULAR | Status: AC
  Filled 2016-10-16: qty 2

## 2016-10-16 MED ORDER — DEXTROSE-NACL 5-0.9 % IV SOLN
INTRAVENOUS | Status: DC
  Administered 2016-10-16 (×2): via INTRAVENOUS

## 2016-10-16 MED ORDER — MORPHINE SULFATE 2 MG/ML IV SOLN
INTRAVENOUS | Status: DC
  Administered 2016-10-16: 21 mg via INTRAVENOUS
  Administered 2016-10-16 (×2): via INTRAVENOUS
  Administered 2016-10-16: 19.5 mg via INTRAVENOUS
  Administered 2016-10-17: 16.5 mg via INTRAVENOUS
  Administered 2016-10-17: 10.5 mg via INTRAVENOUS
  Administered 2016-10-17: 7.5 mg via INTRAVENOUS
  Filled 2016-10-16 (×2): qty 25

## 2016-10-16 MED ORDER — ROCURONIUM BROMIDE 100 MG/10ML IV SOLN
INTRAVENOUS | Status: DC | PRN
  Administered 2016-10-16: 50 mg via INTRAVENOUS
  Administered 2016-10-16 (×2): 10 mg via INTRAVENOUS

## 2016-10-16 MED ORDER — KETOROLAC TROMETHAMINE 30 MG/ML IJ SOLN
INTRAMUSCULAR | Status: AC
  Filled 2016-10-16: qty 1

## 2016-10-16 MED ORDER — KETOROLAC TROMETHAMINE 30 MG/ML IJ SOLN: 30.0000 mg | Freq: Once | INTRAMUSCULAR | Status: DC | PRN

## 2016-10-16 MED ORDER — FENTANYL CITRATE (PF) 100 MCG/2ML IJ SOLN
INTRAMUSCULAR | Status: AC
  Filled 2016-10-16: qty 2

## 2016-10-16 MED ORDER — KETOROLAC TROMETHAMINE 30 MG/ML IJ SOLN
INTRAMUSCULAR | Status: DC | PRN
  Administered 2016-10-16: 30 mg via INTRAVENOUS

## 2016-10-16 MED ORDER — SUGAMMADEX SODIUM 200 MG/2ML IV SOLN
INTRAVENOUS | Status: AC
  Filled 2016-10-16: qty 2

## 2016-10-16 MED ORDER — KETOROLAC TROMETHAMINE 30 MG/ML IJ SOLN
30.0000 mg | Freq: Four times a day (QID) | INTRAMUSCULAR | Status: DC
  Administered 2016-10-16 – 2016-10-18 (×6): 30 mg via INTRAVENOUS
  Filled 2016-10-16 (×6): qty 1

## 2016-10-16 MED ORDER — PROPOFOL 10 MG/ML IV BOLUS
INTRAVENOUS | Status: AC
  Filled 2016-10-16: qty 20

## 2016-10-16 MED ORDER — FENTANYL CITRATE (PF) 250 MCG/5ML IJ SOLN
INTRAMUSCULAR | Status: AC
  Filled 2016-10-16: qty 5

## 2016-10-16 MED ORDER — NALOXONE HCL 0.4 MG/ML IJ SOLN: 0.4000 mg | INTRAMUSCULAR | Status: DC | PRN

## 2016-10-16 MED ORDER — DEXAMETHASONE SODIUM PHOSPHATE 10 MG/ML IJ SOLN
INTRAMUSCULAR | Status: AC
  Filled 2016-10-16: qty 1

## 2016-10-16 MED ORDER — DIPHENHYDRAMINE HCL 12.5 MG/5ML PO ELIX: 12.5000 mg | ORAL_SOLUTION | Freq: Four times a day (QID) | ORAL | Status: DC | PRN

## 2016-10-16 MED ORDER — SODIUM CHLORIDE 0.9 % IJ SOLN
INTRAMUSCULAR | Status: DC | PRN
  Administered 2016-10-16: 30 mL via INTRAVENOUS

## 2016-10-16 SURGICAL SUPPLY — 49 items
BARRIER ADHS 3X4 INTERCEED (GAUZE/BANDAGES/DRESSINGS) IMPLANT
BENZOIN TINCTURE PRP APPL 2/3 (GAUZE/BANDAGES/DRESSINGS) ×4 IMPLANT
BLADE SURG 10 STRL SS (BLADE) ×4 IMPLANT
CANISTER SUCT 3000ML (MISCELLANEOUS) ×4 IMPLANT
CLOSURE WOUND 1/2 X4 (GAUZE/BANDAGES/DRESSINGS) ×1
CLOTH BEACON ORANGE TIMEOUT ST (SAFETY) ×4 IMPLANT
CONT PATH 16OZ SNAP LID 3702 (MISCELLANEOUS) ×4 IMPLANT
DECANTER SPIKE VIAL GLASS SM (MISCELLANEOUS) ×4 IMPLANT
DRAPE CESAREAN BIRTH W POUCH (DRAPES) ×4 IMPLANT
DRAPE WARM FLUID 44X44 (DRAPE) IMPLANT
DRSG OPSITE POSTOP 4X10 (GAUZE/BANDAGES/DRESSINGS) ×4 IMPLANT
DURAPREP 26ML APPLICATOR (WOUND CARE) ×4 IMPLANT
GAUZE SPONGE 4X4 16PLY XRAY LF (GAUZE/BANDAGES/DRESSINGS) IMPLANT
GLOVE BIO SURGEON STRL SZ7.5 (GLOVE) ×4 IMPLANT
GLOVE BIOGEL PI IND STRL 7.0 (GLOVE) ×6 IMPLANT
GLOVE BIOGEL PI IND STRL 7.5 (GLOVE) ×2 IMPLANT
GLOVE BIOGEL PI IND STRL 8 (GLOVE) ×2 IMPLANT
GLOVE BIOGEL PI INDICATOR 7.0 (GLOVE) ×6
GLOVE BIOGEL PI INDICATOR 7.5 (GLOVE) ×2
GLOVE BIOGEL PI INDICATOR 8 (GLOVE) ×2
GLOVE ECLIPSE 7.5 STRL STRAW (GLOVE) ×4 IMPLANT
GOWN STRL REUS W/TWL LRG LVL3 (GOWN DISPOSABLE) ×12 IMPLANT
HEMOSTAT ARISTA ABSORB 3G PWDR (MISCELLANEOUS) ×4 IMPLANT
NEEDLE HYPO 22GX1.5 SAFETY (NEEDLE) ×4 IMPLANT
NS IRRIG 1000ML POUR BTL (IV SOLUTION) ×8 IMPLANT
PACK ABDOMINAL GYN (CUSTOM PROCEDURE TRAY) ×4 IMPLANT
PAD OB MATERNITY 4.3X12.25 (PERSONAL CARE ITEMS) ×4 IMPLANT
PENCIL BUTTON HOLSTER BLD 10FT (ELECTRODE) ×4 IMPLANT
PROTECTOR NERVE ULNAR (MISCELLANEOUS) ×4 IMPLANT
RTRCTR C-SECT PINK 25CM LRG (MISCELLANEOUS) ×4 IMPLANT
SPONGE LAP 18X18 X RAY DECT (DISPOSABLE) ×8 IMPLANT
STAPLER VISISTAT 35W (STAPLE) IMPLANT
STRIP CLOSURE SKIN 1/2X4 (GAUZE/BANDAGES/DRESSINGS) ×3 IMPLANT
SUT CHROMIC 3 0 TIES (SUTURE) IMPLANT
SUT SILK 3 0 SH 30 (SUTURE) IMPLANT
SUT VIC AB 0 CT1 18XCR BRD8 (SUTURE) ×6 IMPLANT
SUT VIC AB 0 CT1 27 (SUTURE) ×6
SUT VIC AB 0 CT1 27XBRD ANBCTR (SUTURE) ×6 IMPLANT
SUT VIC AB 0 CT1 8-18 (SUTURE) ×6
SUT VIC AB 2-0 CT1 27 (SUTURE)
SUT VIC AB 2-0 CT1 TAPERPNT 27 (SUTURE) IMPLANT
SUT VIC AB 2-0 SH 27 (SUTURE) ×2
SUT VIC AB 2-0 SH 27XBRD (SUTURE) ×2 IMPLANT
SUT VIC AB 4-0 KS 27 (SUTURE) ×4 IMPLANT
SUT VICRYL 0 TIES 12 18 (SUTURE) ×4 IMPLANT
SYR CONTROL 10ML LL (SYRINGE) ×4 IMPLANT
TOWEL OR 17X24 6PK STRL BLUE (TOWEL DISPOSABLE) ×8 IMPLANT
TRAY FOLEY CATH SILVER 14FR (SET/KITS/TRAYS/PACK) ×4 IMPLANT
WATER STERILE IRR 1000ML POUR (IV SOLUTION) IMPLANT

## 2016-10-16 NOTE — Op Note (Signed)
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 Clinical weight 329 g  Findings:  Operative:  Anterior cul-de-sac normal. Posterior cul-de-sac normal. Uterus enlarged with multiple myomas ranging from pedunculated, subserosal and intramural. Right and left fallopian tubes normal length caliber and fimbriated ends. Falope rings noted bilaterally and removed. Right and left ovaries grossly normal. Appendix free and mobile. Upper abdominal palpable exam normal.  Procedure:  The patient was taken to the operating room, placed in the supine position, underwent general anesthesia, received an abdominal preparation with DuraPrep and a vaginal/perineal preparation with Betadine and an indwelling Foley catheterization was placed in sterile technique. The timeout was performed by the surgical team. The patient was draped in the usual fashion and the abdomen was sharply entered through a Pfannenstiel incision achieving adequate hemostasis at all levels. A Balfour retractor and bladder blade were placed and the intestines were packed from the operative field. The uterus was elevated from the pelvis, the right round ligament was transected with electrocautery and the right uterine ovarian pedicle was doubly clamped cut and doubly ligated using 0 Vicryl suture. A similar procedure was carried out on the other side. The vesicouterine peritoneal fold was then sharply incised anteriorly and the bladder flap was sharply and bluntly developed without difficulty. Due to the bulk  of the uterus limiting exposure to the lower uterine segment/cervix the right and left uterine vessels were skeletonized, clamped cut and doubly ligated using 0 Vicryl suture and a supracervical hysterectomy was performed. The cervix was then elevated and the paracervical tissues were clamped cut and ligated using 0 Vicryl suture to free the cervix from its attachments. The upper vagina was then crossclamped and the cervix was excised intact without difficulty. The right and left vaginal angle sutures were placed using 0 Vicryl suture and tagged for future reference. The intervening vagina was closed anterior to posterior using 0 Vicryl suture in interrupted figure-of-eight stitch. Attention was then turned to the salpingectomies and the left fallopian tube was elevated with Babcock clamps and clamped along the mesosalpinx with care taken to avoid the infundibulopelvic vasculature to the ovary. A Falope ring was identified in the mesosalpinx which was removed and sent with the specimen and the fallopian tube was excised and ligated using 0 Vicryl suture. A similar procedure was carried out on the other side also removing a Falope ring. The pelvis was then copiously irrigated showing adequate hemostasis and prophylactic Arista was placed into the cul-de-sac to maintain ongoing hemostasis. The Balfour retractor and bladder blade were removed as were the bowel packing and the subcutaneous tissues and subfascial tissues were injected using Exparel. The fascia was then reapproximated using 0 Vicryl suture in a running stitch starting at the angle and meeting in the middle. The subcutaneous tissues were irrigated with adequate hemostasis achieved with electrocautery and the skin was re-approximated using 4-0 Vicryl in a running subcuticular stitch. Steri-Strips with benzoin were then applied as was a sterile dressing. The patient received intraoperative Toradol, was awakened without difficulty and taken to recovery in  good condition  having tolerated procedure well.     Anastasio Auerbach MD, 11:44 AM 10/16/2016

## 2016-10-16 NOTE — H&P (Signed)
The patient was examined.  I reviewed the proposed surgery and consent form with the patient.  The dictated history and physical is current and accurate and all questions were answered. The patient is ready to proceed with surgery and has a realistic understanding and expectation for the outcome.   Anastasio Auerbach MD, 9:50 AM 10/16/2016

## 2016-10-16 NOTE — Transfer of Care (Signed)
Immediate Anesthesia Transfer of Care Note  Patient: Tracy Bowen  Procedure(s) Performed: Procedure(s) with comments: HYSTERECTOMY ABDOMINAL (N/A) - request to follow around 10:30am.  Needs two hours OR time. BILATERAL SALPINGECTOMY (Bilateral)  Patient Location: PACU  Anesthesia Type:General  Level of Consciousness: sedated  Airway & Oxygen Therapy: Patient Spontanous Breathing and Patient connected to nasal cannula oxygen  Post-op Assessment: Report given to RN and Post -op Vital signs reviewed and stable  Post vital signs: Reviewed and stable  Last Vitals:  Vitals:   10/16/16 0917  BP: 109/90  Pulse: 88  Resp: 20  Temp: 36.7 C    Last Pain:  Vitals:   10/16/16 0917  TempSrc: Oral      Patients Stated Pain Goal: 3 ([id number])  Complications: No apparent anesthesia complications

## 2016-10-16 NOTE — Anesthesia Preprocedure Evaluation (Addendum)
Anesthesia Evaluation  Patient identified by MRN, date of birth, ID band Patient awake    Reviewed: Allergy & Precautions, NPO status , Patient's Chart, lab work & pertinent test results  Airway Mallampati: II  TM Distance: >3 FB Neck ROM: Full    Dental  (+) Dental Advisory Given   Pulmonary neg pulmonary ROS,    breath sounds clear to auscultation       Cardiovascular negative cardio ROS   Rhythm:Regular Rate:Normal     Neuro/Psych  Headaches,    GI/Hepatic Neg liver ROS, GERD  ,  Endo/Other  negative endocrine ROS  Renal/GU negative Renal ROS     Musculoskeletal  (+) Arthritis ,   Abdominal   Peds  Hematology negative hematology ROS (+)   Anesthesia Other Findings   Reproductive/Obstetrics                            Lab Results  Component Value Date   WBC 6.9 10/12/2016   HGB 12.2 10/12/2016   HCT 37.0 10/12/2016   MCV 83.1 10/12/2016   PLT 221 10/12/2016   Lab Results  Component Value Date   CREATININE 0.94 06/07/2016   BUN 7 06/07/2016   NA 135 06/07/2016   K 3.9 06/07/2016   CL 102 06/07/2016   CO2 22 06/07/2016    Anesthesia Physical Anesthesia Plan  ASA: II  Anesthesia Plan: General   Post-op Pain Management:    Induction: Intravenous  Airway Management Planned: Oral ETT  Additional Equipment:   Intra-op Plan:   Post-operative Plan: Extubation in OR  Informed Consent: I have reviewed the patients History and Physical, chart, labs and discussed the procedure including the risks, benefits and alternatives for the proposed anesthesia with the patient or authorized representative who has indicated his/her understanding and acceptance.   Dental advisory given  Plan Discussed with: CRNA  Anesthesia Plan Comments:         Anesthesia Quick Evaluation

## 2016-10-16 NOTE — Anesthesia Procedure Notes (Signed)
Procedure Name: Intubation Date/Time: 10/16/2016 10:09 AM Performed by: Tobin Chad Pre-anesthesia Checklist: Patient identified, Emergency Drugs available, Patient being monitored and Suction available Patient Re-evaluated:Patient Re-evaluated prior to inductionOxygen Delivery Method: Circle system utilized and Simple face mask Preoxygenation: Pre-oxygenation with 100% oxygen Intubation Type: IV induction and Inhalational induction Ventilation: Mask ventilation without difficulty Laryngoscope Size: Mac and 3 Grade View: Grade II Tube type: Oral Number of attempts: 1 Placement Confirmation: ETT inserted through vocal cords under direct vision,  positive ETCO2 and breath sounds checked- equal and bilateral Secured at: 22 cm Tube secured with: Tape Dental Injury: Teeth and Oropharynx as per pre-operative assessment

## 2016-10-16 NOTE — Anesthesia Postprocedure Evaluation (Signed)
Anesthesia Post Note  Patient: Tracy Bowen  Procedure(s) Performed: Procedure(s) (LRB): HYSTERECTOMY ABDOMINAL (N/A) BILATERAL SALPINGECTOMY (Bilateral)  Patient location during evaluation: [facility] Anesthesia Type: General Level of consciousness: awake and alert Pain management: satisfactory to patient Vital Signs Assessment: post-procedure vital signs reviewed and stable Respiratory status: spontaneous breathing and respiratory function stable Cardiovascular status: stable Postop Assessment: adequate PO intake Anesthetic complications: no     Last Vitals:  Vitals:   10/16/16 1529 10/16/16 1633  BP: 137/86 134/88  Pulse: (!) 105 (!) 108  Resp: 20 16  Temp: 37 C 36.8 C    Last Pain:  Vitals:   10/16/16 1633  TempSrc: Oral  PainSc:    Pain Goal: Patients Stated Pain Goal: 3 (10/16/16 1418)               Cadie Sorci

## 2016-10-16 NOTE — Progress Notes (Signed)
Patient ID: Tracy Bowen, female   DOB: Jun 20, 1976, 40 y.o.   MRN: [id number]  In to see patient who is ambulating in the hall.  Reports pain managed by PCA.  Urine output good.  Reviewed surgery with her.  Continue routine postoperative care.

## 2016-10-17 ENCOUNTER — Encounter (HOSPITAL_COMMUNITY): Payer: Self-pay | Admitting: Gynecology

## 2016-10-17 DIAGNOSIS — D252 Subserosal leiomyoma of uterus: Secondary | ICD-10-CM | POA: Diagnosis present

## 2016-10-17 DIAGNOSIS — D500 Iron deficiency anemia secondary to blood loss (chronic): Secondary | ICD-10-CM | POA: Diagnosis present

## 2016-10-17 DIAGNOSIS — D251 Intramural leiomyoma of uterus: Secondary | ICD-10-CM | POA: Diagnosis present

## 2016-10-17 DIAGNOSIS — K219 Gastro-esophageal reflux disease without esophagitis: Secondary | ICD-10-CM | POA: Diagnosis present

## 2016-10-17 DIAGNOSIS — N920 Excessive and frequent menstruation with regular cycle: Secondary | ICD-10-CM | POA: Diagnosis present

## 2016-10-17 MED ORDER — OXYCODONE-ACETAMINOPHEN 5-325 MG PO TABS
2.0000 | ORAL_TABLET | Freq: Four times a day (QID) | ORAL | Status: DC | PRN
  Administered 2016-10-17 – 2016-10-18 (×4): 2 via ORAL
  Filled 2016-10-17 (×6): qty 2

## 2016-10-17 MED ORDER — DIPHENHYDRAMINE HCL 25 MG PO CAPS
50.0000 mg | ORAL_CAPSULE | Freq: Four times a day (QID) | ORAL | Status: DC | PRN
  Administered 2016-10-17: 50 mg via ORAL
  Filled 2016-10-17: qty 2

## 2016-10-17 MED ORDER — ONDANSETRON HCL 4 MG PO TABS: 4.0000 mg | ORAL_TABLET | Freq: Three times a day (TID) | ORAL | Status: DC | PRN

## 2016-10-17 NOTE — Progress Notes (Signed)
Patient ID: Tracy Bowen, female   DOB: 1976/05/09, 40 y.o.   MRN: [id number]  Doing well with adequate pain relief with PO meds.  Ambulating, voiding, taking PO well.  Dressing dry/intact.  Abdomen soft with minimal tenderness.  Reviewed precautions & instructions with the patient in anticipation of discharge in AM.  Questions answered.

## 2016-10-17 NOTE — Progress Notes (Signed)
Tracy Bowen January 06, 1976 [id number]   1 Day Post-Op s/p Procedure(s): HYSTERECTOMY ABDOMINAL BILATERAL SALPINGECTOMY  Subjective: Patient reports no acute distress, pain severity reported moderate, Yes.   taking PO, foley catheter out, Yes.   voiding, Yes.   ambulating, No. passing flatus  Objective: Vital signs in last 24 hours: Temp:  [98 F (36.7 C)-98.8 F (37.1 C)] 98.4 F (36.9 C) (11/08 0506) Pulse Rate:  [88-109] 90 (11/08 0506) Resp:  [15-20] 18 (11/08 0627) BP: (109-137)/(72-90) 118/75 (11/08 0506) SpO2:  [97 %-100 %] 99 % (11/08 0506) Weight:  [270 lb (122.5 kg)] 270 lb (122.5 kg) (11/07 1744)    EXAM General: awake, alert and no distress Resp: clear to auscultation bilaterally Cardio: regular rate and rhythm and regularly irregular rhythm GI: soft, mild to moderate tenderness, bowel sounds active, dressing Lower Extremities: Without swelling or tenderness Vaginal Bleeding: reported scant   Assessment: s/p Procedure(s): HYSTERECTOMY ABDOMINAL BILATERAL SALPINGECTOMY: stable and progressing well  Plan: Continue routine post operative care, Advance diet Encourage ambulation Advance to PO medication  LOS: 1 day    Anastasio Auerbach MD, 7:54 AM 10/17/2016     Patient ID: Tracy Bowen, female   DOB: [birth year], 40 y.o.   MRN: [id number]

## 2016-10-17 NOTE — Progress Notes (Signed)
Patient has eaten two breakfasts

## 2016-10-18 MED ORDER — OXYCODONE-ACETAMINOPHEN 5-325 MG PO TABS: 2.0000 | ORAL_TABLET | Freq: Four times a day (QID) | ORAL | 0 refills | Status: DC | PRN

## 2016-10-18 MED ORDER — INFLUENZA VAC SPLIT QUAD 0.5 ML IM SUSY: 0.5000 mL | PREFILLED_SYRINGE | INTRAMUSCULAR | 0 refills | Status: AC

## 2016-10-18 NOTE — Progress Notes (Signed)
Patient ID: Tracy Bowen, female   DOB: 1976-07-09, 40 y.o.   MRN: [id number] TYNEISHA FICO 1975/12/17 [id number]   2 Days Post-Op Procedure(s) (LRB): HYSTERECTOMY ABDOMINAL (N/A) BILATERAL SALPINGECTOMY (Bilateral)  Subjective: Patient reports has no problems, no acute distress, pain severity reported mild, Yes.   taking PO, foley catheter out, Yes.   voiding, Yes.   ambulating, Yes.   passing flatus  Objective: Vital signs in last 24 hours: Temp:  [98.2 F (36.8 C)-99.5 F (37.5 C)] 99.5 F (37.5 C) (11/09 0544) Pulse Rate:  [80-91] 84 (11/09 0544) Resp:  [18-20] 20 (11/09 0544) BP: (107-112)/(53-73) 110/73 (11/09 0544) SpO2:  [100 %] 100 % (11/09 0544)      EXAM General: awake, alert and no distress Resp: clear to auscultation bilaterally Cardio: regular rate and rhythm GI: soft, minimal tenderness, bowel sounds active, dressing dry Lower Extremities: Without swelling or tenderness Vaginal Bleeding: Reported scant    Assessment: s/p Procedure(s): HYSTERECTOMY ABDOMINAL BILATERAL SALPINGECTOMY: progressing well, ready for discharge.    Plan: Discharge home today.  Precautions, instructions and follow up were discussed with the patient.  Patient to remove dressing at home in 2 days.  Prescriptions provided per AVS.  Patient to call the office to arrange a post-operative appointmant in 2 weeks.    Anastasio Auerbach MD, 7:19 AM 10/18/2016

## 2016-10-18 NOTE — Discharge Instructions (Signed)
°  Postoperative Instructions Hysterectomy ° °Dr. Alylah Blakney and the nursing staff have discussed postoperative instructions with you.  If you have any questions please ask them before you leave the hospital, or call Dr Seichi Kaufhold’s office at [phone].   ° °We would like to emphasize the following instructions: ° ° °  Call the office to make your follow-up appointment as recommended by Dr Erasmo Vertz (usually 2 weeks). ° °  You were given a prescription, or one was ordered for you at the pharmacy you designated.  Get that prescription filled and take the medication according to instructions. ° °  You may eat a regular diet, but slowly until you start having bowel movements. ° °  Drink plenty of water daily. ° °  Nothing in the vagina (intercourse, douching, objects of any kind) until released by Dr Joshiah Traynham. ° °  No driving for two weeks.  Wait to be cleared by Dr Nuriyah Hanline at your first post op check.  Car rides (short) are ok after several days at home, as long as you are not having significant pain, but no traveling out of town. ° °  You may shower, but no baths.  Walking up and down stairs is ok.  No heavy lifting, prolonged standing, repeated bending or any “working out” until your first post op check. ° °  Rest frequently, listen to your body and do not push yourself and overdo it. ° °  Call if: ° °o Your pain medication does not seem strong enough. °o Worsening pain or abdominal bloating °o Persistent nausea or vomiting °o Difficulty with urination or bowel movements. °o Temperature of 101 degrees or higher. °o Bleeding heavier then staining (clots or period type flow). °o Incisions become red, tender or begin to drain. °o You have any questions or concerns. °

## 2016-10-18 NOTE — Progress Notes (Signed)
Discharge teaching complete. Pt understood all information and did not have any questions. Pt pushed via wheelchair and discharged home to family. 

## 2016-10-22 NOTE — Discharge Summary (Signed)
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 She was discharged on postoperative day #2 ambulating well, tolerating a regular diet, voiding without difficulty, with good pain relief with oral medication. The patient received instructions for postoperative care and call precautions.  She received prescriptions per AVS and will be seen in the office 2 weeks following discharge.       Anastasio Auerbach MD, 4:10 PM 10/22/2016

## 2016-10-25 ENCOUNTER — Telehealth: Payer: Self-pay | Admitting: *Deleted

## 2016-10-25 MED ORDER — METRONIDAZOLE 500 MG PO TABS: 500.0000 mg | ORAL_TABLET | Freq: Two times a day (BID) | ORAL | 0 refills | Status: DC

## 2016-10-25 NOTE — Telephone Encounter (Signed)
Pt is post abdominal hysterectomy on 10/16/16 doing well, notes clear vaginal discharge with vaginal odor, history of recurrent BV infections. Pt asked if Rx could be prescribed? Please advise

## 2016-10-25 NOTE — Telephone Encounter (Signed)
As long as everything else seems okay like she is voiding without difficulty, not having any urinary symptoms, no fever or chills or other complaints than Flagyl 500 mg twice a day 7 days okay. Alcohol avoidance while taking

## 2016-10-25 NOTE — Telephone Encounter (Signed)
Left message for pt to call.

## 2016-10-25 NOTE — Telephone Encounter (Signed)
Pt aware, Rx sent, she is not having any other issues.

## 2016-10-30 ENCOUNTER — Encounter: Payer: Self-pay | Admitting: Gynecology

## 2016-10-30 ENCOUNTER — Ambulatory Visit (INDEPENDENT_AMBULATORY_CARE_PROVIDER_SITE_OTHER): Payer: BC Managed Care – PPO | Admitting: Gynecology

## 2016-10-30 VITALS — BP 118/76

## 2016-10-30 DIAGNOSIS — Z9889 Other specified postprocedural states: Secondary | ICD-10-CM

## 2016-10-30 NOTE — Progress Notes (Addendum)
    Tracy Bowen 24-Jan-1976 [id number]        40 y.o.  [id number] presents for her postoperative visit status post TAH bilateral salpingectomies 2 weeks ago. Has done well without complaints.  Past medical history,surgical history, problem list, medications, allergies, family history and social history were all reviewed and documented in the EPIC chart.  Directed ROS with pertinent positives and negatives documented in the history of present illness/assessment and plan.  Exam: Tracy Bowen assistant Vitals:   10/30/16 1432  BP: 118/76   General appearance:  Normal Abdomen soft nontender with incision healed nicely. Remaining Steri-Strips removed Pelvic external BUS vagina normal with cuff healing nicely. Bimanual without masses or tenderness  Assessment/Plan:  40 y.o. [id number] with normal postoperative visit status post TAH bilateral salpingectomies for leiomyoma and heavy bleeding. Has done well postoperatively. We'll slowly resume activities with the exception of continue pelvic rest. Reviewed benign pathology and pictures from the surgery with her. Follow up in 2 weeks, sooner if any issues    Anastasio Auerbach MD, 2:48 PM 10/30/2016

## 2016-10-30 NOTE — Patient Instructions (Signed)
Follow up for postoperative visit in 2 weeks slowly resume normal activities with the exception of nothing in the vagina. Also no formal exercising until I see you back again

## 2016-11-15 ENCOUNTER — Ambulatory Visit (INDEPENDENT_AMBULATORY_CARE_PROVIDER_SITE_OTHER): Payer: BC Managed Care – PPO | Admitting: Gynecology

## 2016-11-15 ENCOUNTER — Encounter: Payer: Self-pay | Admitting: Gynecology

## 2016-11-15 VITALS — BP 116/74

## 2016-11-15 DIAGNOSIS — Z9889 Other specified postprocedural states: Secondary | ICD-10-CM

## 2016-11-15 NOTE — Patient Instructions (Signed)
Follow up for your annual exam in June 2018 when due.  Slowly resume normal activities with the exception of nothing in the vagina for another 2-4 weeks.

## 2016-11-15 NOTE — Progress Notes (Signed)
    Tracy Bowen 09/24/1976 [id number]        40 y.o.  [id number] presents for weeks postop status post TAH bilateral salpingectomies. Doing well without complaints.  Past medical history,surgical history, problem list, medications, allergies, family history and social history were all reviewed and documented in the EPIC chart.  Directed ROS with pertinent positives and negatives documented in the history of present illness/assessment and plan.  Exam: Tracy Bowen assistant Vitals:   11/15/16 0949  BP: 116/74   General appearance:  Normal Abdomen soft nontender without masses guarding rebound. Incision healed nicely. Pelvic external BUS vagina normal with cuff healing nicely. I'm annual exam without masses or tenderness  Assessment/Plan:  40 y.o. [id number] with normal postoperative visit status post TVH bilateral salpingectomies. Will slowly resume normal activities with the exception of pelvic rest for another 2-4 weeks. Assuming she does well then will follow up in June at her annual exam, sooner if any issues.    Tracy Auerbach MD, 9:57 AM 11/15/2016

## 2017-01-09 ENCOUNTER — Ambulatory Visit (INDEPENDENT_AMBULATORY_CARE_PROVIDER_SITE_OTHER): Payer: BC Managed Care – PPO | Admitting: Physician Assistant

## 2017-01-09 VITALS — BP 128/80 | HR 96 | Temp 98.5°F | Resp 16 | Ht 70.0 in | Wt 280.0 lb

## 2017-01-09 DIAGNOSIS — T50995A Adverse effect of other drugs, medicaments and biological substances, initial encounter: Secondary | ICD-10-CM | POA: Diagnosis not present

## 2017-01-09 MED ORDER — FLUOCINOLONE ACETONIDE 0.01 % EX SOLN: Freq: Two times a day (BID) | CUTANEOUS | 3 refills | Status: DC

## 2017-01-09 NOTE — Progress Notes (Signed)
  01/14/2017 10:38 AM   DOB: [birth year] / MRN: [id number]  SUBJECTIVE:  Tracy Bowen is a 41 y.o. female presenting for itching about her scalp that started 1 week ago. She has taken benadryl that this has helped with itching and swelling.  She is getting better.   This symptoms is preceded by a new product that was applied to her hair by her normal hair stylist.  She has had this before and saw Dr. Linna Bowen who gave her prednisone along with flucinolone.   She has No Known Allergies.   She  has a past medical history of Anemia; Anxiety; Arthritis; Depression; Headache; History of kidney stones; HSV-2 infection; Leiomyoma (09/2012); Mild acid reflux (WATCHES DIET); and Seasonal allergies.    She  reports that she has never smoked. She has never used smokeless tobacco. She reports that she does not drink alcohol or use drugs. She  reports that she does not currently engage in sexual activity. She reports using the following method of birth control/protection: Surgical. The patient  has a past surgical history that includes Knee arthroscopy w/ ACL reconstruction and patella graft (02-15-2000); Laparoscopic cholecystectomy (10-23-2007); Dilation and curettage of uterus (1997); Induced abortion (2002); Tubal ligation (10/03/2012); Abdominal hysterectomy (N/A, 10/16/2016); and Bilateral salpingectomy (Bilateral, 10/16/2016).  Her family history includes Cancer in her maternal grandmother; Diabetes in her paternal grandmother; Hypertension in her mother.  Review of Systems  HENT: Negative for sore throat.   Cardiovascular: Negative for palpitations.  Gastrointestinal: Negative for nausea.  Skin: Positive for itching and rash.  Neurological: Negative for dizziness.    The problem list and medications were reviewed and updated by myself where necessary and exist elsewhere in the encounter.   OBJECTIVE:  BP 128/80 (BP Location: Right Arm, Patient Position: Sitting, Cuff Size: Large)   Pulse 96    Temp 98.5 F (36.9 C) (Oral)   Resp 16   Ht 5\' 10"  (1.778 m)   Wt 280 lb (127 kg)   LMP 06/08/2016 Comment: BTL  SpO2 100%   BMI 40.18 kg/m   Physical Exam  Constitutional: She appears well-developed and well-nourished.  HENT:  Mouth/Throat: Uvula is midline, oropharynx is clear and moist and mucous membranes are normal.    Cardiovascular: Normal rate and regular rhythm.   Pulmonary/Chest: Effort normal and breath sounds normal.    No results found for this or any previous visit (from the past 72 hour(s)).  No results found.  ASSESSMENT AND PLAN:  Tracy Bowen was seen today for allergic reaction.  Diagnoses and all orders for this visit:  Dye allergic reaction, initial encounter: I do not appreciate a siignifant rash today.  She has tried the below before with good relief.  I'd like to avoid oral pred if possible.  Will start with this and if not improving will start oral therapy.  -     fluocinolone (SYNALAR) 0.01 % external solution; Apply topically 2 (two) times daily.    The patient is advised to call or return to clinic if she does not see an improvement in symptoms, or to seek the care of the closest emergency department if she worsens with the above plan.   Tracy Bowen, MHS, PA-C [facility] 01/14/2017 10:38 AM

## 2017-01-09 NOTE — Patient Instructions (Signed)
     IF you received an x-ray today, you will receive an invoice from [facility]. Please contact [facility] at [phone] with questions or concerns regarding your invoice.   IF you received labwork today, you will receive an invoice from [facility]. Please contact [facility] at [phone] with questions or concerns regarding your invoice.   Our billing staff will not be able to assist you with questions regarding bills from these companies.  You will be contacted with the lab results as soon as they are available. The fastest way to get your results is to activate your My Chart account. Instructions are located on the last page of this paperwork. If you have not heard from us regarding the results in 2 weeks, please contact this office.     

## 2017-02-27 ENCOUNTER — Other Ambulatory Visit: Payer: Self-pay | Admitting: Gynecology

## 2017-02-27 ENCOUNTER — Telehealth: Payer: Self-pay

## 2017-02-27 MED ORDER — METRONIDAZOLE 500 MG PO TABS: 500.0000 mg | ORAL_TABLET | Freq: Two times a day (BID) | ORAL | 0 refills | Status: DC

## 2017-02-27 NOTE — Telephone Encounter (Signed)
Left message for patient that Dr. Loetta Rough did prescribe Rx I just need to confirm her pharmacy. Did leave message as well that if continues to be recurrent Dr. Loetta Rough wants her to have OV.  Also, reminded her to avoid alcohol w Rx.

## 2017-02-27 NOTE — Telephone Encounter (Signed)
Patient called requesting refill on Metronidazole 500 #14 S: one bid x 7days.  She said she had the same recurring bacterial infection every mos.  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 Would like Rx if possible.

## 2017-02-27 NOTE — Telephone Encounter (Signed)
Okay for Flagyl 500 mg twice a day 7 days. Will need office visit if continues to be a recurrent issue. Alcohol avoidance with medication

## 2017-02-27 NOTE — Telephone Encounter (Signed)
Patient called and confirmed her pharmacy. Rx sent.

## 2017-04-24 ENCOUNTER — Encounter: Payer: Self-pay | Admitting: Gynecology

## 2017-06-10 ENCOUNTER — Encounter: Payer: BC Managed Care – PPO | Admitting: Gynecology

## 2017-06-14 ENCOUNTER — Encounter: Payer: Self-pay | Admitting: Gynecology

## 2017-06-14 ENCOUNTER — Ambulatory Visit (INDEPENDENT_AMBULATORY_CARE_PROVIDER_SITE_OTHER): Payer: BC Managed Care – PPO | Admitting: Gynecology

## 2017-06-14 VITALS — BP 124/82 | Ht 70.0 in | Wt 279.0 lb

## 2017-06-14 DIAGNOSIS — Z01411 Encounter for gynecological examination (general) (routine) with abnormal findings: Secondary | ICD-10-CM | POA: Diagnosis not present

## 2017-06-14 DIAGNOSIS — Z13220 Encounter for screening for lipoid disorders: Secondary | ICD-10-CM

## 2017-06-14 DIAGNOSIS — N898 Other specified noninflammatory disorders of vagina: Secondary | ICD-10-CM | POA: Diagnosis not present

## 2017-06-14 LAB — COMPREHENSIVE METABOLIC PANEL
ALBUMIN: 4.2 g/dL (ref 3.6–5.1)
ALT: 11 U/L (ref 6–29)
AST: 16 U/L (ref 10–30)
Alkaline Phosphatase: 113 U/L (ref 33–115)
BILIRUBIN TOTAL: 0.4 mg/dL (ref 0.2–1.2)
BUN: 7 mg/dL (ref 7–25)
CALCIUM: 9.1 mg/dL (ref 8.6–10.2)
CHLORIDE: 104 mmol/L (ref 98–110)
CO2: 21 mmol/L (ref 20–31)
Creat: 0.92 mg/dL (ref 0.50–1.10)
Glucose, Bld: 104 mg/dL — ABNORMAL HIGH (ref 65–99)
Potassium: 3.8 mmol/L (ref 3.5–5.3)
SODIUM: 135 mmol/L (ref 135–146)
TOTAL PROTEIN: 7.4 g/dL (ref 6.1–8.1)

## 2017-06-14 LAB — CBC WITH DIFFERENTIAL/PLATELET
BASOS ABS: 0 {cells}/uL (ref 0–200)
Basophils Relative: 0 %
EOS ABS: 124 {cells}/uL (ref 15–500)
Eosinophils Relative: 2 %
HEMATOCRIT: 38.8 % (ref 35.0–45.0)
HEMOGLOBIN: 12.5 g/dL (ref 11.7–15.5)
LYMPHS ABS: 2232 {cells}/uL (ref 850–3900)
Lymphocytes Relative: 36 %
MCH: 27.8 pg (ref 27.0–33.0)
MCHC: 32.2 g/dL (ref 32.0–36.0)
MCV: 86.2 fL (ref 80.0–100.0)
MONO ABS: 248 {cells}/uL (ref 200–950)
MPV: 10.7 fL (ref 7.5–12.5)
Monocytes Relative: 4 %
NEUTROS ABS: 3596 {cells}/uL (ref 1500–7800)
NEUTROS PCT: 58 %
Platelets: 276 10*3/uL (ref 140–400)
RBC: 4.5 MIL/uL (ref 3.80–5.10)
RDW: 15.4 % — ABNORMAL HIGH (ref 11.0–15.0)
WBC: 6.2 10*3/uL (ref 3.8–10.8)

## 2017-06-14 LAB — WET PREP FOR TRICH, YEAST, CLUE
Clue Cells Wet Prep HPF POC: NONE SEEN
Trich, Wet Prep: NONE SEEN
WBC WET PREP: NONE SEEN
Yeast Wet Prep HPF POC: NONE SEEN

## 2017-06-14 LAB — LIPID PANEL
Cholesterol: 207 mg/dL — ABNORMAL HIGH (ref <200)
HDL: 66 mg/dL (ref >50)
LDL Cholesterol: 120 mg/dL — ABNORMAL HIGH (ref <100)
Total CHOL/HDL Ratio: 3.1 Ratio (ref <5.0)
Triglycerides: 104 mg/dL (ref <150)
VLDL: 21 mg/dL (ref <30)

## 2017-06-14 MED ORDER — FLUCONAZOLE 150 MG PO TABS: 150.0000 mg | ORAL_TABLET | Freq: Once | ORAL | 0 refills | Status: AC

## 2017-06-14 NOTE — Progress Notes (Signed)
    Tracy Bowen March 07, 1976 [id number]        41 y.o.  [id number] for annual exam.  Also complaining over the last week or 2 a white discharge with itching. No urinary symptoms such as frequency dysuria or urgency low back pain fever or chills. No vaginal odor.  Past medical history,surgical history, problem list, medications, allergies, family history and social history were all reviewed and documented as reviewed in the EPIC chart.  ROS:  Performed with pertinent positives and negatives included in the history, assessment and plan.   Additional significant findings :  None   Exam: Tracy Bowen assistant Vitals:   06/14/17 1555  BP: 124/82  Weight: 279 lb (126.6 kg)  Height: 5\' 10"  (1.778 m)   Body mass index is 40.03 kg/m.  General appearance:  Normal affect, orientation and appearance. Skin: Grossly normal HEENT: Without gross lesions.  No cervical or supraclavicular adenopathy. Thyroid normal.  Lungs:  Clear without wheezing, rales or rhonchi Cardiac: RR, without RMG Abdominal:  Soft, nontender, without masses, guarding, rebound, organomegaly or hernia Breasts:  Examined lying and sitting without masses, retractions, discharge or axillary adenopathy. Pelvic:  Ext, BUS, Vagina: With slight white discharge  Adnexa: Without masses or tenderness    Anus and perineum: Normal   Rectovaginal: Normal sphincter tone without palpated masses or tenderness.    Assessment/Plan:  41 y.o. 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   1. White discharge with itching. Wet prep is negative. Clinical diagnosis is [place]. Will treat with Diflucan 150 mg 1 dose. Follow up if symptoms persist, worsen or recur. 2. Pap smear/HPV 08/2014. No Pap smear done today. No history of significant abnormal Pap smears. Options to stop screening per current screening guidelines based on hysterectomy for benign indications reviewed. Will  readdress on an annual basis. 3. Breast health. SBE monthly reviewed. Plan Baseline mammogram at age 41. No strong family history of breast cancer. Breast exam normal today. 4. Health maintenance. Baseline CBC, CMP and lipid profile ordered. Follow up in one year, sooner as needed.  Additional time in excess of her routine gynecologic exam was spent in direct face to face counseling and coordination of care in regards to her vaginal discharge with evaluation and treatment.    Tracy Auerbach MD, 4:24 PM 06/14/2017

## 2017-06-14 NOTE — Patient Instructions (Signed)
Take the Diflucan pill to treat the vaginal discharge. Follow up if the symptoms persist or recur.  Follow up in one year for annual exam.

## 2017-06-17 ENCOUNTER — Other Ambulatory Visit: Payer: Self-pay | Admitting: Gynecology

## 2017-06-17 DIAGNOSIS — E7800 Pure hypercholesterolemia, unspecified: Secondary | ICD-10-CM

## 2017-06-17 DIAGNOSIS — R7309 Other abnormal glucose: Secondary | ICD-10-CM

## 2017-06-19 ENCOUNTER — Other Ambulatory Visit: Payer: BC Managed Care – PPO

## 2017-06-19 DIAGNOSIS — R7309 Other abnormal glucose: Secondary | ICD-10-CM

## 2017-06-19 DIAGNOSIS — E7800 Pure hypercholesterolemia, unspecified: Secondary | ICD-10-CM

## 2017-06-19 LAB — LIPID PANEL
Cholesterol: 185 mg/dL (ref <200)
HDL: 56 mg/dL (ref >50)
LDL CALC: 112 mg/dL — AB (ref <100)
TRIGLYCERIDES: 87 mg/dL (ref <150)
Total CHOL/HDL Ratio: 3.3 Ratio (ref <5.0)
VLDL: 17 mg/dL (ref <30)

## 2017-06-20 LAB — GLUCOSE, FASTING: GLUCOSE, FASTING: 92 mg/dL (ref 65–99)

## 2017-06-24 ENCOUNTER — Encounter: Payer: Self-pay | Admitting: *Deleted

## 2018-01-15 ENCOUNTER — Telehealth: Payer: Self-pay

## 2018-01-15 MED ORDER — METRONIDAZOLE 500 MG PO TABS: 500.0000 mg | ORAL_TABLET | Freq: Two times a day (BID) | ORAL | 0 refills | Status: DC

## 2018-01-15 NOTE — Telephone Encounter (Signed)
Patient said prior to surgery she had some bouts of BV infection but has not had it since surgery until now.  She asked if you would treat her over the phone or would she need to come in. She said sx are the same.  Vag discharge-no color, slight odor, slight irritation/itching.

## 2018-01-15 NOTE — Telephone Encounter (Signed)
Okay for Flagyl 500 mg twice daily times 7 days.  Alcohol avoidance reviewed.  Office visit if symptoms continue.

## 2018-01-15 NOTE — Telephone Encounter (Signed)
Left message in patient's voice mail per DPR access note informing/advising her. Rx sent.

## 2018-05-26 ENCOUNTER — Ambulatory Visit: Payer: BC Managed Care – PPO | Admitting: Gynecology

## 2018-05-26 ENCOUNTER — Encounter: Payer: Self-pay | Admitting: Gynecology

## 2018-05-26 VITALS — BP 118/78

## 2018-05-26 DIAGNOSIS — N898 Other specified noninflammatory disorders of vagina: Secondary | ICD-10-CM | POA: Diagnosis not present

## 2018-05-26 LAB — WET PREP FOR TRICH, YEAST, CLUE

## 2018-05-26 MED ORDER — METRONIDAZOLE 500 MG PO TABS: 500.0000 mg | ORAL_TABLET | Freq: Two times a day (BID) | ORAL | 0 refills | Status: DC

## 2018-05-26 NOTE — Progress Notes (Signed)
    Tracy Bowen [birth year] [id number]        42 y.o.  [id number] resents with 1 week history of vaginal discharge and odor.  No real itching or irritation.  No urinary symptoms such as frequency dysuria urgency low back pain fever or chills.  No nausea vomiting diarrhea constipation.  Past medical history,surgical history, problem list, medications, allergies, family history and social history were all reviewed and documented in the EPIC chart.  Directed ROS with pertinent positives and negatives documented in the history of present illness/assessment and plan.  Exam: [name] Assistant Vitals:   05/26/18 1427  BP: 118/78   General appearance:  Normal Abdomen soft nontender without masses guarding rebound Pelvic external BUS vagina with white discharge.  Bimanual without masses or tenderness.   Assessment/Plan:  42 y.o. O1B5102 with history and exam suspicious for low-grade bacterial vaginosis.  Wet prep unremarkable.  Options for treatment reviewed and will cover for low-grade bacterial vaginosis with Flagyl 500 mg twice daily x7 days.  Alcohol avoidance reviewed.  Follow-up if symptoms persist, worsen or recur.    Anastasio Auerbach MD, 2:45 PM 05/26/2018

## 2018-05-26 NOTE — Patient Instructions (Signed)
Take the prescribed medication twice daily for 7 days.  Avoid alcohol while taking.  Follow-up if your symptoms persist, worsen or recur.

## 2018-07-08 ENCOUNTER — Encounter: Payer: Self-pay | Admitting: Gynecology

## 2018-07-08 ENCOUNTER — Ambulatory Visit: Payer: BC Managed Care – PPO | Admitting: Gynecology

## 2018-07-08 VITALS — BP 124/78 | Ht 70.0 in | Wt 267.0 lb

## 2018-07-08 DIAGNOSIS — Z13220 Encounter for screening for lipoid disorders: Secondary | ICD-10-CM

## 2018-07-08 DIAGNOSIS — N898 Other specified noninflammatory disorders of vagina: Secondary | ICD-10-CM | POA: Diagnosis not present

## 2018-07-08 DIAGNOSIS — Z01411 Encounter for gynecological examination (general) (routine) with abnormal findings: Secondary | ICD-10-CM

## 2018-07-08 LAB — COMPREHENSIVE METABOLIC PANEL
AG RATIO: 1.4 (calc) (ref 1.0–2.5)
ALBUMIN MSPROF: 4.1 g/dL (ref 3.6–5.1)
ALKALINE PHOSPHATASE (APISO): 118 U/L — AB (ref 33–115)
ALT: 10 U/L (ref 6–29)
AST: 14 U/L (ref 10–30)
BUN: 9 mg/dL (ref 7–25)
CHLORIDE: 104 mmol/L (ref 98–110)
CO2: 25 mmol/L (ref 20–32)
Calcium: 9.3 mg/dL (ref 8.6–10.2)
Creat: 1.07 mg/dL (ref 0.50–1.10)
GLOBULIN: 3 g/dL (ref 1.9–3.7)
Glucose, Bld: 80 mg/dL (ref 65–99)
POTASSIUM: 3.9 mmol/L (ref 3.5–5.3)
Sodium: 137 mmol/L (ref 135–146)
Total Bilirubin: 0.3 mg/dL (ref 0.2–1.2)
Total Protein: 7.1 g/dL (ref 6.1–8.1)

## 2018-07-08 LAB — CBC WITH DIFFERENTIAL/PLATELET
BASOS PCT: 0.4 %
Basophils Absolute: 22 cells/uL (ref 0–200)
EOS ABS: 291 {cells}/uL (ref 15–500)
Eosinophils Relative: 5.2 %
HEMATOCRIT: 37 % (ref 35.0–45.0)
HEMOGLOBIN: 12.6 g/dL (ref 11.7–15.5)
LYMPHS ABS: 2083 {cells}/uL (ref 850–3900)
MCH: 29.4 pg (ref 27.0–33.0)
MCHC: 34.1 g/dL (ref 32.0–36.0)
MCV: 86.4 fL (ref 80.0–100.0)
MPV: 11.6 fL (ref 7.5–12.5)
Monocytes Relative: 6 %
Neutro Abs: 2867 cells/uL (ref 1500–7800)
Neutrophils Relative %: 51.2 %
PLATELETS: 224 10*3/uL (ref 140–400)
RBC: 4.28 10*6/uL (ref 3.80–5.10)
RDW: 13 % (ref 11.0–15.0)
TOTAL LYMPHOCYTE: 37.2 %
WBC: 5.6 10*3/uL (ref 3.8–10.8)
WBCMIX: 336 {cells}/uL (ref 200–950)

## 2018-07-08 LAB — LIPID PANEL
CHOL/HDL RATIO: 3.7 (calc) (ref <5.0)
Cholesterol: 210 mg/dL — ABNORMAL HIGH (ref <200)
HDL: 57 mg/dL (ref >50)
LDL CHOLESTEROL (CALC): 128 mg/dL — AB
Non-HDL Cholesterol (Calc): 153 mg/dL (calc) — ABNORMAL HIGH (ref <130)
Triglycerides: 136 mg/dL (ref <150)

## 2018-07-08 LAB — WET PREP FOR TRICH, YEAST, CLUE

## 2018-07-08 MED ORDER — TINIDAZOLE 500 MG PO TABS: 2.0000 g | ORAL_TABLET | Freq: Every day | ORAL | 0 refills | Status: DC

## 2018-07-08 NOTE — Patient Instructions (Signed)
Call to Schedule your mammogram  Facilities in [place]: 1)  [facility]. [facility], [address] Phone: [phone] 2)  Dr. Bertrand at [facility]  [address] Phone: [phone]     Mammogram A mammogram is an X-ray test to find changes in a woman's breast. You should get a mammogram if:  You are 40 years of age or older  You have risk factors.   Your doctor recommends that you have one.  BEFORE THE TEST  Do not schedule the test the week before your period, especially if your breasts are sore during this time.  On the day of your mammogram:  Wash your breasts and armpits well. After washing, do not put on any deodorant or talcum powder on until after your test.   Eat and drink as you usually do.   Take your medicines as usual.   If you are diabetic and take insulin, make sure you:   Eat before coming for your test.   Take your insulin as usual.   If you cannot keep your appointment, call before the appointment to cancel. Schedule another appointment.  TEST  You will need to undress from the waist up. You will put on a hospital gown.   Your breast will be put on the mammogram machine, and it will press firmly on your breast with a piece of plastic called a compression paddle. This will make your breast flatter so that the machine can X-ray all parts of your breast.   Both breasts will be X-rayed. Each breast will be X-rayed from above and from the side. An X-ray might need to be taken again if the picture is not good enough.   The mammogram will last about 15 to 30 minutes.  AFTER THE TEST Finding out the results of your test Ask when your test results will be ready. Make sure you get your test results.  Document Released: 02/22/2009 Document Revised: 11/15/2011 Document Reviewed: 02/22/2009 Exit[employer] Patient Information 2012 [employer].   

## 2018-07-08 NOTE — Progress Notes (Signed)
     COURTNAY PETRILLA [birth year] [id number]        42 y.o.  [id number] for annual gynecologic exam.  Also notes several days of vaginal discharge with irritation.  Recently treated last month for bacterial vaginosis with complete resolution of her symptoms.  No urinary symptoms such as frequency dysuria urgency low back pain fever or chills.  Past medical history,surgical history, problem list, medications, allergies, family history and social history were all reviewed and documented as reviewed in the EPIC chart.  ROS:  Performed with pertinent positives and negatives included in the history, assessment and plan.   Additional significant findings : None   Exam: [name] assistant Vitals:   07/08/18 1149  BP: 124/78  Weight: 267 lb (121.1 kg)  Height: 5\' 10"  (1.778 m)   Body mass index is 38.31 kg/m.  General appearance:  Normal affect, orientation and appearance. Skin: Grossly normal HEENT: Without gross lesions.  No cervical or supraclavicular adenopathy. Thyroid normal.  Lungs:  Clear without wheezing, rales or rhonchi Cardiac: RR, without RMG Abdominal:  Soft, nontender, without masses, guarding, rebound, organomegaly or hernia Breasts:  Examined lying and sitting without masses, retractions, discharge or axillary adenopathy. Pelvic:  Ext, BUS, Vagina: With white discharge.  Adnexa: Without masses or tenderness    Anus and perineum: Normal   Rectovaginal: Normal sphincter tone without palpated masses or tenderness.    Assessment/Plan:  42 y.o. [id number] female for annual gynecologic exam status post TAH bilateral salpingectomies 2017 for leiomyoma and bleeding..   1. White discharge with irritation.  Wet prep is negative.  I suspect she has an early bacterial vaginosis given her history.  Options for management reviewed and ultimately we agree on Tindamax 2 g daily x2 days.  Alcohol avoidance reviewed.  Follow-up if symptoms persist, worsen or recur. 2. Pap smear/HPV  08/2014.  No Pap smear done today.  No history of abnormal Pap smears.  Reviewed current screening guidelines and options to stop screening altogether or continue screening at intervals discussed.  Will review on an annual basis. 3. Turning 42 next month.  Recommend baseline mammogram this coming year and she agrees to schedule.  Breast exam normal today. 4. Health maintenance.  Baseline CBC, CMP and lipid profile ordered.  Follow-up in 1 year, sooner as needed.   Anastasio Auerbach MD, 12:58 PM 07/08/2018

## 2018-07-09 ENCOUNTER — Other Ambulatory Visit: Payer: Self-pay | Admitting: Gynecology

## 2018-07-09 DIAGNOSIS — E7800 Pure hypercholesterolemia, unspecified: Secondary | ICD-10-CM

## 2018-07-16 ENCOUNTER — Other Ambulatory Visit: Payer: Self-pay | Admitting: Gynecology

## 2018-07-16 DIAGNOSIS — Z1231 Encounter for screening mammogram for malignant neoplasm of breast: Secondary | ICD-10-CM

## 2018-08-12 ENCOUNTER — Ambulatory Visit: Payer: BC Managed Care – PPO

## 2018-09-17 ENCOUNTER — Ambulatory Visit
Admission: RE | Admit: 2018-09-17 | Discharge: 2018-09-17 | Disposition: A | Discharge status: Home / Self Care | Payer: BC Managed Care – PPO | Source: Ambulatory Visit | Attending: Gynecology | Admitting: Gynecology

## 2018-09-17 DIAGNOSIS — Z1231 Encounter for screening mammogram for malignant neoplasm of breast: Secondary | ICD-10-CM

## 2018-10-29 ENCOUNTER — Ambulatory Visit: Payer: BC Managed Care – PPO | Admitting: Women's Health

## 2018-10-29 ENCOUNTER — Encounter: Payer: Self-pay | Admitting: Women's Health

## 2018-10-29 VITALS — BP 118/80

## 2018-10-29 DIAGNOSIS — B3731 Acute candidiasis of vulva and vagina: Secondary | ICD-10-CM

## 2018-10-29 DIAGNOSIS — B373 Candidiasis of vulva and vagina: Secondary | ICD-10-CM

## 2018-10-29 LAB — WET PREP FOR TRICH, YEAST, CLUE

## 2018-10-29 MED ORDER — FLUCONAZOLE 150 MG PO TABS: 150.0000 mg | ORAL_TABLET | Freq: Once | ORAL | 1 refills | Status: AC

## 2018-10-29 NOTE — Progress Notes (Signed)
42 year old SPF G3, P1 presents with complaint of vaginal discharge with itching for the past 3 days, questionable odor.  Denies urinary symptoms, abdominal pain, GI changes,  or fever.  2017 TAH for menorrhagia and fibroids.  Same partner greater than 7 years denies need for STD screen.  6 grade teacher.  Exam: Appears well.  Abdomen soft, obese, nontender.  External genitalia mild erythema and introitus, speculum exam vaginal walls with mild erythema, scant white discharge, no odor noted, wet prep negative.  Probable yeast vaginitis  Plan: Diflucan 150 p.o. x1 dose, yeast prevention discussed.  Instructed to call if no relief.

## 2018-10-29 NOTE — Patient Instructions (Signed)

## 2019-04-20 ENCOUNTER — Other Ambulatory Visit: Payer: Self-pay

## 2019-04-21 ENCOUNTER — Ambulatory Visit: Payer: BC Managed Care – PPO | Admitting: Gynecology

## 2019-04-21 ENCOUNTER — Encounter: Payer: Self-pay | Admitting: Gynecology

## 2019-04-21 VITALS — BP 118/76

## 2019-04-21 DIAGNOSIS — B9689 Other specified bacterial agents as the cause of diseases classified elsewhere: Secondary | ICD-10-CM

## 2019-04-21 DIAGNOSIS — N760 Acute vaginitis: Secondary | ICD-10-CM | POA: Diagnosis not present

## 2019-04-21 LAB — WET PREP FOR TRICH, YEAST, CLUE

## 2019-04-21 MED ORDER — METRONIDAZOLE 500 MG PO TABS: 500.0000 mg | ORAL_TABLET | Freq: Two times a day (BID) | ORAL | 0 refills | Status: DC

## 2019-04-21 NOTE — Addendum Note (Signed)
Addended by: Nelva Nay on: 04/21/2019 11:28 AM   Modules accepted: Orders

## 2019-04-21 NOTE — Progress Notes (Signed)
    Tracy Bowen Apr 22, 1976 [id number]        42 y.o.  [id number] presents with several days of vaginal itching, irritation and slight odor.  No discharge.  Has not self medicated.  No urinary symptoms such as frequency dysuria urgency low back pain fever or chills.  Past medical history,surgical history, problem list, medications, allergies, family history and social history were all reviewed and documented in the EPIC chart.  Directed ROS with pertinent positives and negatives documented in the history of present illness/assessment and plan.  Exam: Tracy Bowen assistant Vitals:   04/21/19 1008  BP: 118/76   General appearance:  Normal Abdomen soft nontender without masses guarding rebound Pelvic external BUS vagina with slight white discharge.  Bimanual without masses or tenderness  Assessment/Plan:  43 y.o. O7S9628 with history and exam as above.  Wet prep consistent with low-grade bacterial vaginosis.  Options for management reviewed and patient elects for Flagyl 500 mg twice daily x7 days.  Alcohol avoidance reviewed.  Follow-up if symptoms persist, worsen or recur.    Tracy Auerbach MD, 10:25 AM 04/21/2019

## 2019-04-21 NOTE — Patient Instructions (Signed)
Take the antibiotic pills twice daily for 7 days.  Avoid alcohol while taking.  Call if your symptoms persist, worsen or recur.

## 2019-07-10 ENCOUNTER — Encounter: Payer: BC Managed Care – PPO | Admitting: Gynecology

## 2019-07-16 ENCOUNTER — Other Ambulatory Visit: Payer: Self-pay

## 2019-07-17 ENCOUNTER — Ambulatory Visit: Payer: BC Managed Care – PPO | Admitting: Gynecology

## 2019-07-17 ENCOUNTER — Encounter: Payer: Self-pay | Admitting: Gynecology

## 2019-07-17 VITALS — BP 122/82 | Ht 70.0 in | Wt 277.0 lb

## 2019-07-17 DIAGNOSIS — Z13220 Encounter for screening for lipoid disorders: Secondary | ICD-10-CM

## 2019-07-17 DIAGNOSIS — Z01419 Encounter for gynecological examination (general) (routine) without abnormal findings: Secondary | ICD-10-CM | POA: Diagnosis not present

## 2019-07-17 NOTE — Progress Notes (Signed)
    Tracy Bowen September 02, 1976 [id number]        42 y.o.  [id number] for annual gynecologic exam.  Without gynecologic complaints  Past medical history,surgical history, problem list, medications, allergies, family history and social history were all reviewed and documented as reviewed in the EPIC chart.  ROS:  Performed with pertinent positives and negatives included in the history, assessment and plan.   Additional significant findings : None   Exam: [name] assistant Vitals:   07/17/19 1358  BP: 122/82  Weight: 277 lb (125.6 kg)  Height: 5\' 10"  (1.778 m)   Body mass index is 39.75 kg/m.  General appearance:  Normal affect, orientation and appearance. Skin: Grossly normal HEENT: Without gross lesions.  No cervical or supraclavicular adenopathy. Thyroid normal.  Lungs:  Clear without wheezing, rales or rhonchi Cardiac: RR, without RMG Abdominal:  Soft, nontender, without masses, guarding, rebound, organomegaly or hernia Breasts:  Examined lying and sitting without masses, retractions, discharge or axillary adenopathy. Pelvic:  Ext, BUS, Vagina: Normal.  Pap smear of vaginal cuff  Adnexa: Without masses or tenderness    Anus and perineum: Normal   Rectovaginal: Normal sphincter tone without palpated masses or tenderness.    Assessment/Plan:  43 y.o. [id number] female for annual gynecologic exam.  Status post TAH bilateral salpingectomies 2017 for leiomyoma and bleeding.  1. Pap smear/HPV 2015.  Pap smear done today.  No history of abnormal Pap smears.  Options to stop screening per current screening guidelines based on hysterectomy history reviewed.  Will reassess on an annual basis. 2. Mammography coming due in October and I reminded her to schedule this.  Breast exam normal today. 3. Health maintenance.  Requests baseline labs.  CBC, CMP and lipid profile ordered.  Follow-up 1 year, sooner as needed.   Anastasio Auerbach MD, 2:22 PM 07/17/2019

## 2019-07-17 NOTE — Patient Instructions (Signed)
Follow-up in 1 year for annual exam, sooner as needed. 

## 2019-07-17 NOTE — Addendum Note (Signed)
Addended by: Nelva Nay on: 07/17/2019 03:02 PM   Modules accepted: Orders

## 2019-07-18 LAB — CBC WITH DIFFERENTIAL/PLATELET
Absolute Monocytes: 466 cells/uL (ref 200–950)
Basophils Absolute: 22 cells/uL (ref 0–200)
Basophils Relative: 0.3 %
Eosinophils Absolute: 89 cells/uL (ref 15–500)
Eosinophils Relative: 1.2 %
HCT: 38.7 % (ref 35.0–45.0)
Hemoglobin: 12.7 g/dL (ref 11.7–15.5)
Lymphs Abs: 2790 cells/uL (ref 850–3900)
MCH: 29.2 pg (ref 27.0–33.0)
MCHC: 32.8 g/dL (ref 32.0–36.0)
MCV: 89 fL (ref 80.0–100.0)
MPV: 11.5 fL (ref 7.5–12.5)
Monocytes Relative: 6.3 %
Neutro Abs: 4033 cells/uL (ref 1500–7800)
Neutrophils Relative %: 54.5 %
Platelets: 215 10*3/uL (ref 140–400)
RBC: 4.35 10*6/uL (ref 3.80–5.10)
RDW: 13.5 % (ref 11.0–15.0)
Total Lymphocyte: 37.7 %
WBC: 7.4 10*3/uL (ref 3.8–10.8)

## 2019-07-18 LAB — COMPREHENSIVE METABOLIC PANEL
AG Ratio: 1.3 (calc) (ref 1.0–2.5)
ALT: 11 U/L (ref 6–29)
AST: 13 U/L (ref 10–30)
Albumin: 4 g/dL (ref 3.6–5.1)
Alkaline phosphatase (APISO): 99 U/L (ref 31–125)
BUN: 9 mg/dL (ref 7–25)
CO2: 21 mmol/L (ref 20–32)
Calcium: 8.8 mg/dL (ref 8.6–10.2)
Chloride: 105 mmol/L (ref 98–110)
Creat: 0.96 mg/dL (ref 0.50–1.10)
Globulin: 3.1 g/dL (calc) (ref 1.9–3.7)
Glucose, Bld: 90 mg/dL (ref 65–99)
Potassium: 4.2 mmol/L (ref 3.5–5.3)
Sodium: 138 mmol/L (ref 135–146)
Total Bilirubin: 0.3 mg/dL (ref 0.2–1.2)
Total Protein: 7.1 g/dL (ref 6.1–8.1)

## 2019-07-18 LAB — LIPID PANEL
Cholesterol: 215 mg/dL — ABNORMAL HIGH (ref <200)
HDL: 54 mg/dL (ref >=50)
LDL Cholesterol (Calc): 129 mg/dL (calc) — ABNORMAL HIGH
Non-HDL Cholesterol (Calc): 161 mg/dL (calc) — ABNORMAL HIGH (ref <130)
Total CHOL/HDL Ratio: 4 (calc) (ref <5.0)
Triglycerides: 180 mg/dL — ABNORMAL HIGH (ref <150)

## 2019-07-20 ENCOUNTER — Encounter: Payer: Self-pay | Admitting: Gynecology

## 2019-07-20 LAB — PAP IG W/ RFLX HPV ASCU

## 2019-09-01 ENCOUNTER — Encounter: Payer: Self-pay | Admitting: Gynecology

## 2019-09-30 ENCOUNTER — Other Ambulatory Visit: Payer: Self-pay | Admitting: Gynecology

## 2019-09-30 DIAGNOSIS — Z1231 Encounter for screening mammogram for malignant neoplasm of breast: Secondary | ICD-10-CM

## 2019-11-18 ENCOUNTER — Ambulatory Visit
Admission: RE | Admit: 2019-11-18 | Discharge: 2019-11-18 | Disposition: A | Discharge status: Home / Self Care | Payer: BC Managed Care – PPO | Source: Ambulatory Visit | Attending: Gynecology | Admitting: Gynecology

## 2019-11-18 ENCOUNTER — Other Ambulatory Visit: Payer: Self-pay

## 2019-11-18 DIAGNOSIS — Z1231 Encounter for screening mammogram for malignant neoplasm of breast: Secondary | ICD-10-CM

## 2019-12-23 ENCOUNTER — Ambulatory Visit: Payer: BC Managed Care – PPO | Attending: Internal Medicine

## 2019-12-23 DIAGNOSIS — Z20822 Contact with and (suspected) exposure to covid-19: Secondary | ICD-10-CM

## 2019-12-24 LAB — NOVEL CORONAVIRUS, NAA: SARS-CoV-2, NAA: NOT DETECTED

## 2020-01-18 ENCOUNTER — Ambulatory Visit: Payer: BC Managed Care – PPO | Attending: Internal Medicine

## 2020-01-18 DIAGNOSIS — Z20822 Contact with and (suspected) exposure to covid-19: Secondary | ICD-10-CM

## 2020-01-19 LAB — NOVEL CORONAVIRUS, NAA: SARS-CoV-2, NAA: NOT DETECTED

## 2020-02-24 ENCOUNTER — Other Ambulatory Visit: Payer: Self-pay

## 2020-02-25 MED ORDER — VALACYCLOVIR HCL 500 MG PO TABS: ORAL_TABLET | ORAL | 0 refills | Status: AC

## 2020-03-06 ENCOUNTER — Other Ambulatory Visit: Payer: Self-pay | Admitting: Obstetrics and Gynecology

## 2020-12-05 ENCOUNTER — Other Ambulatory Visit: Payer: Self-pay | Admitting: Obstetrics and Gynecology

## 2020-12-05 DIAGNOSIS — Z1231 Encounter for screening mammogram for malignant neoplasm of breast: Secondary | ICD-10-CM

## 2020-12-22 ENCOUNTER — Other Ambulatory Visit: Payer: Self-pay

## 2020-12-22 DIAGNOSIS — Z20822 Contact with and (suspected) exposure to covid-19: Secondary | ICD-10-CM

## 2020-12-24 LAB — NOVEL CORONAVIRUS, NAA: SARS-CoV-2, NAA: NOT DETECTED

## 2020-12-24 LAB — SARS-COV-2, NAA 2 DAY TAT

## 2021-01-12 ENCOUNTER — Ambulatory Visit: Payer: BC Managed Care – PPO

## 2021-03-08 ENCOUNTER — Ambulatory Visit
Admission: RE | Admit: 2021-03-08 | Discharge: 2021-03-08 | Disposition: A | Discharge status: Home / Self Care | Payer: Self-pay | Source: Ambulatory Visit | Attending: Obstetrics and Gynecology | Admitting: Obstetrics and Gynecology

## 2021-03-08 ENCOUNTER — Other Ambulatory Visit: Payer: Self-pay

## 2021-03-08 DIAGNOSIS — Z1231 Encounter for screening mammogram for malignant neoplasm of breast: Secondary | ICD-10-CM

## 2022-07-16 ENCOUNTER — Other Ambulatory Visit: Payer: Self-pay | Admitting: Emergency Medicine

## 2022-07-16 DIAGNOSIS — Z1231 Encounter for screening mammogram for malignant neoplasm of breast: Secondary | ICD-10-CM

## 2022-07-18 ENCOUNTER — Ambulatory Visit
Admission: RE | Admit: 2022-07-18 | Discharge: 2022-07-18 | Disposition: A | Discharge status: Home / Self Care | Payer: BC Managed Care – PPO | Source: Ambulatory Visit | Attending: Emergency Medicine | Admitting: Emergency Medicine

## 2022-07-18 DIAGNOSIS — Z1231 Encounter for screening mammogram for malignant neoplasm of breast: Secondary | ICD-10-CM

## 2023-05-16 ENCOUNTER — Telehealth: Payer: Self-pay

## 2023-05-16 NOTE — Telephone Encounter (Signed)
Received referral from Dr Duanne Guess for hx of Sleep apnea - needs new study and new CPAP. Placed in sleep mailbox

## 2023-06-19 ENCOUNTER — Institutional Professional Consult (permissible substitution): Payer: BC Managed Care – PPO | Admitting: Neurology

## 2023-08-21 ENCOUNTER — Other Ambulatory Visit: Payer: Self-pay | Admitting: Nurse Practitioner

## 2023-08-21 DIAGNOSIS — Z1231 Encounter for screening mammogram for malignant neoplasm of breast: Secondary | ICD-10-CM

## 2023-10-08 ENCOUNTER — Ambulatory Visit
Admission: RE | Admit: 2023-10-08 | Discharge: 2023-10-08 | Disposition: A | Discharge status: Home / Self Care | Payer: BC Managed Care – PPO | Source: Ambulatory Visit | Attending: Nurse Practitioner | Admitting: Nurse Practitioner

## 2023-10-08 DIAGNOSIS — Z1231 Encounter for screening mammogram for malignant neoplasm of breast: Secondary | ICD-10-CM

## 2024-01-13 ENCOUNTER — Emergency Department (HOSPITAL_COMMUNITY)
Admission: EM | Admit: 2024-01-13 | Discharge: 2024-01-13 | Disposition: A | Discharge status: Home / Self Care | Payer: 59 | Attending: Student | Admitting: Student

## 2024-01-13 ENCOUNTER — Other Ambulatory Visit: Payer: Self-pay

## 2024-01-13 ENCOUNTER — Emergency Department (HOSPITAL_COMMUNITY): Payer: 59

## 2024-01-13 DIAGNOSIS — R0789 Other chest pain: Secondary | ICD-10-CM | POA: Diagnosis not present

## 2024-01-13 DIAGNOSIS — R000 Tachycardia, unspecified: Secondary | ICD-10-CM | POA: Diagnosis not present

## 2024-01-13 DIAGNOSIS — R079 Chest pain, unspecified: Secondary | ICD-10-CM | POA: Diagnosis present

## 2024-01-13 LAB — BASIC METABOLIC PANEL
Anion gap: 8 (ref 5–15)
BUN: 7 mg/dL (ref 6–20)
CO2: 22 mmol/L (ref 22–32)
Calcium: 8.8 mg/dL — ABNORMAL LOW (ref 8.9–10.3)
Chloride: 105 mmol/L (ref 98–111)
Creatinine, Ser: 0.76 mg/dL (ref 0.44–1.00)
GFR, Estimated: >60 mL/min (ref >60)
Glucose, Bld: 108 mg/dL — ABNORMAL HIGH (ref 70–99)
Potassium: 3.6 mmol/L (ref 3.5–5.1)
Sodium: 135 mmol/L (ref 135–145)

## 2024-01-13 LAB — CBC
HCT: 38.3 % (ref 36.0–46.0)
Hemoglobin: 12.6 g/dL (ref 12.0–15.0)
MCH: 29.2 pg (ref 26.0–34.0)
MCHC: 32.9 g/dL (ref 30.0–36.0)
MCV: 88.9 fL (ref 80.0–100.0)
Platelets: 246 10*3/uL (ref 150–400)
RBC: 4.31 MIL/uL (ref 3.87–5.11)
RDW: 13.9 % (ref 11.5–15.5)
WBC: 7 10*3/uL (ref 4.0–10.5)
nRBC: 0 % (ref 0.0–0.2)

## 2024-01-13 LAB — D-DIMER, QUANTITATIVE: D-Dimer, Quant: 0.41 ug{FEU}/mL (ref 0.00–0.50)

## 2024-01-13 LAB — TROPONIN I (HIGH SENSITIVITY)
Troponin I (High Sensitivity): <2 ng/L (ref <18)
Troponin I (High Sensitivity): 2 ng/L (ref <18)

## 2024-01-13 MED ORDER — LIDOCAINE 5 % EX PTCH
1.0000 | MEDICATED_PATCH | CUTANEOUS | Status: DC
  Administered 2024-01-13: 1 via TRANSDERMAL
  Filled 2024-01-13: qty 1

## 2024-01-13 MED ORDER — LIDOCAINE 4 % EX PTCH: 1.0000 | MEDICATED_PATCH | Freq: Two times a day (BID) | CUTANEOUS | 0 refills | Status: DC

## 2024-01-13 MED ORDER — LIDOCAINE 4 % EX PTCH: 1.0000 | MEDICATED_PATCH | Freq: Two times a day (BID) | CUTANEOUS | 0 refills | Status: AC

## 2024-01-13 MED ORDER — KETOROLAC TROMETHAMINE 15 MG/ML IJ SOLN
15.0000 mg | Freq: Once | INTRAMUSCULAR | Status: AC
  Administered 2024-01-13: 15 mg via INTRAMUSCULAR
  Filled 2024-01-13: qty 1

## 2024-01-13 MED ORDER — ASPIRIN 325 MG PO TABS
325.0000 mg | ORAL_TABLET | Freq: Every day | ORAL | Status: DC
  Administered 2024-01-13: 325 mg via ORAL
  Filled 2024-01-13: qty 1

## 2024-01-13 MED ORDER — NAPROXEN 375 MG PO TABS: 375.0000 mg | ORAL_TABLET | Freq: Two times a day (BID) | ORAL | 0 refills | Status: DC

## 2024-01-13 MED ORDER — NAPROXEN 375 MG PO TABS: 375.0000 mg | ORAL_TABLET | Freq: Two times a day (BID) | ORAL | 0 refills | Status: AC

## 2024-01-13 NOTE — ED Provider Triage Note (Cosign Needed)
Emergency Medicine Provider Triage Evaluation Note  Tracy Bowen , a 48 y.o. female  was evaluated in triage.  Pt complains of chest pain worsening with deep breaths. Pain began this morning and has worsened since. took ibuprofen without improvement. Patient reports she traveled this past weekend to [place].  Review of Systems  Positive: CP Negative: fevers  Physical Exam  BP (!) 155/97 (BP Location: Left Arm)   Pulse 89   Temp 98.3 F (36.8 C) (Oral)   Resp 18   Ht 5\' 9"  (1.753 m)   Wt 133.8 kg   LMP 06/08/2016   SpO2 99%   BMI 43.56 kg/m  Gen:   Awake, no distress   Resp:  Normal effort  MSK:   Moves extremities without difficulty  Other:    Medical Decision Making  Medically screening exam initiated at 1:46 PM.  Appropriate orders placed.  Tracy Bowen was informed that the remainder of the evaluation will be completed by another provider, this initial triage assessment does not replace that evaluation, and the importance of remaining in the ED until their evaluation is complete.   Tracy Marion, PA-C 01/13/24 1349

## 2024-01-13 NOTE — ED Notes (Signed)
Patient verbalizes understanding of discharge instructions. Opportunity for questioning and answers were provided. Armband removed by staff, pt discharged from ED. Ambulated out to lobby  

## 2024-01-13 NOTE — ED Triage Notes (Signed)
Patient to ED by POV with c/o chest pain. Per patient she states pain started about an hour ago and it radiates to her mid back. She denies injury, N/V or SOB

## 2024-01-14 NOTE — ED Provider Notes (Signed)
[place] EMERGENCY DEPARTMENT AT [facility] Provider Note  CSN: [id number] Arrival date & time: 01/13/24 1318  Chief Complaint(s) Chest Pain  HPI Tracy Bowen is a 48 y.o. female with PMH anxiety, anemia, uterine fibroids, GERD who presents emergency department for evaluation of chest pain.  States that she recently traveled to [place] on a short flight and on returning has had pleuritic chest pain worse under the xiphoid process and radiating along the rib line on the right.  Endorses intermittent shortness of breath secondary to pleurisy.  Denies associated nausea, vomiting or diaphoresis.  No exertional component to the pain.  Denies trauma to the chest wall.   Past Medical History Past Medical History:  Diagnosis Date   Anemia    Anxiety    Arthritis    Depression    Headache    History of kidney stones    HSV-2 infection    Leiomyoma 09/2012   Mild acid reflux WATCHES DIET   Seasonal allergies    Patient Active Problem List   Diagnosis Date Noted   Cellulitis 12/15/2014   Allergic reaction 12/13/2014   Cellulitis of face 12/13/2014   Steroid-induced hyperglycemia 12/13/2014   Home Medication(s) Prior to Admission medications   Medication Sig Start Date End Date Taking? Authorizing Provider  cetirizine (ZYRTEC) 10 MG tablet Take 10 mg by mouth every other day. Reported on 01/24/2016    [provider]  lidocaine (SALONPAS PAIN RELIEVING) 4 % Place 1 patch onto the skin every 12 (twelve) hours. 01/13/24   Abdiaziz Klahn, MD  naproxen (NAPROSYN) 375 MG tablet Take 1 tablet (375 mg total) by mouth 2 (two) times daily. 01/13/24   Sophy Mesler, Wyn Forster, MD  valACYclovir (VALTREX) 500 MG tablet Take one tablet BID at onset of symptoms for 3 days then as needed 02/25/20   Theresia Majors, MD                                                                                                                                    Past Surgical History Past Surgical  History:  Procedure Laterality Date   ABDOMINAL HYSTERECTOMY N/A 10/16/2016   Procedure: HYSTERECTOMY ABDOMINAL;  Surgeon: Dara Lords, MD;  Location: [facility];  Service: Gynecology;  Laterality: N/A;  request to follow around 10:30am.  Needs two hours OR time.   BILATERAL SALPINGECTOMY Bilateral 10/16/2016   Procedure: BILATERAL SALPINGECTOMY;  Surgeon: Dara Lords, MD;  Location: [facility];  Service: Gynecology;  Laterality: Bilateral;   DILATION AND CURETTAGE OF UTERUS  1997   W/ SUCTION   INDUCED ABORTION  2002   KNEE ARTHROSCOPY W/ ACL RECONSTRUCTION AND PATELLA GRAFT  02-15-2000   RIGHT KNEE  W/ POST DRAIN REMOVAL 02-16-2000   LAPAROSCOPIC CHOLECYSTECTOMY  10-23-2007   TUBAL LIGATION  10/03/2012   Procedure: BILATERAL TUBAL LIGATION;  Surgeon: Dara Lords, MD;  Location: [facility];  Service: Gynecology;  Laterality: Bilateral;  Laparoscopic Tubal Ligation with Fallope Rings.  2nd choice is 10/10/12 at 7:30am.   Family History Family History  Problem Relation Age of Onset   Hypertension Mother    Dementia Mother    Cancer Maternal Grandmother        LUNG CANCER   Diabetes Paternal Grandmother     Social History Social History   Tobacco Use   Smoking status: Never   Smokeless tobacco: Never  Vaping Use   Vaping status: Never Used  Substance Use Topics   Alcohol use: No    Alcohol/week: 0.0 standard drinks of alcohol   Drug use: No   Allergies Patient has no known allergies.  Review of Systems Review of Systems  Cardiovascular:  Positive for chest pain.    Physical Exam Vital Signs  I have reviewed the triage vital signs BP (!) 135/97   Pulse 79   Temp 98.3 F (36.8 C)   Resp 16   Ht 5\' 9"  (1.753 m)   Wt 133.8 kg   LMP 06/08/2016   SpO2 100%   BMI 43.56 kg/m   Physical Exam Vitals and nursing note reviewed.  Constitutional:      General: She is not in acute distress.    Appearance: She is well-developed.  HENT:      Head: Normocephalic and atraumatic.  Eyes:     Conjunctiva/sclera: Conjunctivae normal.  Cardiovascular:     Rate and Rhythm: Normal rate and regular rhythm.     Heart sounds: No murmur heard. Pulmonary:     Effort: Pulmonary effort is normal. No respiratory distress.     Breath sounds: Normal breath sounds.  Chest:     Chest wall: Tenderness present.  Abdominal:     Palpations: Abdomen is soft.     Tenderness: There is no abdominal tenderness.  Musculoskeletal:        General: No swelling.     Cervical back: Neck supple.  Skin:    General: Skin is warm and dry.     Capillary Refill: Capillary refill takes less than 2 seconds.  Neurological:     Mental Status: She is alert.  Psychiatric:        Mood and Affect: Mood normal.     ED Results and Treatments Labs (all labs ordered are listed, but only abnormal results are displayed) Labs Reviewed  BASIC METABOLIC PANEL - Abnormal; Notable for the following components:      Result Value   Glucose, Bld 108 (*)    Calcium 8.8 (*)    All other components within normal limits  CBC  D-DIMER, QUANTITATIVE  TROPONIN I (HIGH SENSITIVITY)  TROPONIN I (HIGH SENSITIVITY)                                                                                                                          Radiology DG Chest 2 View Result Date: 01/13/2024 CLINICAL DATA:  Acute chest pain beginning  1 hour ago. EXAM: CHEST - 2 VIEW COMPARISON:  03/29/2011 FINDINGS: The heart size and mediastinal contours are within normal limits. Both lungs are clear. The visualized skeletal structures are unremarkable. IMPRESSION: No active cardiopulmonary disease. Electronically Signed   By: Danae Orleans M.D.   On: 01/13/2024 15:33    Pertinent labs & imaging results that were available during my care of the patient were reviewed by me and considered in my medical decision making (see MDM for details).  Medications Ordered in ED Medications  ketorolac (TORADOL) 15  MG/ML injection 15 mg (15 mg Intramuscular Given 01/13/24 1748)                                                                                                                                     Procedures Procedures  (including critical care time)  Medical Decision Making / ED Course   This patient presents to the ED for concern of chest pain, this involves an extensive number of treatment options, and is a complaint that carries with it a high risk of complications and morbidity.  The differential diagnosis includes ACS, Aortic Dissection, Pneumothorax, Pneumonia, Esophageal Rupture, PE, Tamponade/Pericardial Effusion, pericarditis, esophageal spasm, dysrhythmia, GERD, costochondritis.  MDM: Patient seen emergency room for evaluation of chest pain.  Physical exam with reproducible tenderness along the rib line on the right but is otherwise unremarkable.  Pulmonary exam is unremarkable.  No appreciable murmurs heard.  Laboratory evaluation is overall reassuring including negative high-sensitivity troponin and delta troponin, negative D-dimer.  Low suspicion for PE in the setting of a negative D-dimer.  Chest x-ray unremarkable.  ECG nonischemic.  Patient given Toradol and a Lidoderm patch and symptoms are improving.  Given reproducible chest wall tenderness and negative cardiac workup, suspect patient likely suffering from costochondritis and will be discharged on an NSAID regimen.  Low suspicion for ACS and the patient's heart score is low.  Given very strict return precautions of which she voiced understanding and she was discharged with outpatient follow-up.   Additional history obtained:  -External records from outside source obtained and reviewed including: Chart review including previous notes, labs, imaging, consultation notes   Lab Tests: -I ordered, reviewed, and interpreted labs.   The pertinent results include:   Labs Reviewed  BASIC METABOLIC PANEL - Abnormal; Notable for the  following components:      Result Value   Glucose, Bld 108 (*)    Calcium 8.8 (*)    All other components within normal limits  CBC  D-DIMER, QUANTITATIVE  TROPONIN I (HIGH SENSITIVITY)  TROPONIN I (HIGH SENSITIVITY)      EKG   EKG Interpretation Date/Time:  Monday January 13 2024 13:34:02 EST Ventricular Rate:  87 PR Interval:  161 QRS Duration:  66 QT Interval:  448 QTC Calculation: 539 R Axis:   46  Text Interpretation: Sinus rhythm Low voltage, precordial leads Prolonged QT interval Confirmed by  Robbi Scurlock [id number]) on 01/13/2024 4:55:44 PM         Imaging Studies ordered: I ordered imaging studies including chest x-ray I independently visualized and interpreted imaging. I agree with the radiologist interpretation   Medicines ordered and prescription drug management: Meds ordered this encounter  Medications   DISCONTD: aspirin tablet 325 mg   ketorolac (TORADOL) 15 MG/ML injection 15 mg   DISCONTD: lidocaine (LIDODERM) 5 % 1 patch   DISCONTD: naproxen (NAPROSYN) 375 MG tablet    Sig: Take 1 tablet (375 mg total) by mouth 2 (two) times daily.    Dispense:  20 tablet    Refill:  0   DISCONTD: lidocaine (SALONPAS PAIN RELIEVING) 4 %    Sig: Place 1 patch onto the skin every 12 (twelve) hours.    Dispense:  15 patch    Refill:  0   lidocaine (SALONPAS PAIN RELIEVING) 4 %    Sig: Place 1 patch onto the skin every 12 (twelve) hours.    Dispense:  15 patch    Refill:  0   naproxen (NAPROSYN) 375 MG tablet    Sig: Take 1 tablet (375 mg total) by mouth 2 (two) times daily.    Dispense:  20 tablet    Refill:  0    -I have reviewed the patients home medicines and have made adjustments as needed  Critical interventions none    Cardiac Monitoring: The patient was maintained on a cardiac monitor.  I personally viewed and interpreted the cardiac monitored which showed an underlying rhythm of: NSR  Social Determinants of Health:  Factors impacting patients  care include: Recent flight to [place]   Reevaluation: After the interventions noted above, I reevaluated the patient and found that they have :improved  Co morbidities that complicate the patient evaluation  Past Medical History:  Diagnosis Date   Anemia    Anxiety    Arthritis    Depression    Headache    History of kidney stones    HSV-2 infection    Leiomyoma 09/2012   Mild acid reflux WATCHES DIET   Seasonal allergies       Dispostion: I considered admission for this patient, but at this time she does not meet inpatient criteria for admission and will be discharged with outpatient follow-up     Final Clinical Impression(s) / ED Diagnoses Final diagnoses:  Atypical chest pain     @PCDICTATION @    Glendora Score, MD 01/14/24 1248

## 2024-01-17 ENCOUNTER — Emergency Department (HOSPITAL_BASED_OUTPATIENT_CLINIC_OR_DEPARTMENT_OTHER): Payer: 59 | Admitting: Radiology

## 2024-01-17 ENCOUNTER — Other Ambulatory Visit: Payer: Self-pay

## 2024-01-17 ENCOUNTER — Emergency Department (HOSPITAL_BASED_OUTPATIENT_CLINIC_OR_DEPARTMENT_OTHER)
Admission: EM | Admit: 2024-01-17 | Discharge: 2024-01-17 | Disposition: A | Discharge status: Home / Self Care | Payer: 59 | Attending: Emergency Medicine | Admitting: Emergency Medicine

## 2024-01-17 ENCOUNTER — Encounter (HOSPITAL_BASED_OUTPATIENT_CLINIC_OR_DEPARTMENT_OTHER): Payer: Self-pay

## 2024-01-17 DIAGNOSIS — I10 Essential (primary) hypertension: Secondary | ICD-10-CM | POA: Insufficient documentation

## 2024-01-17 DIAGNOSIS — Z79899 Other long term (current) drug therapy: Secondary | ICD-10-CM | POA: Diagnosis not present

## 2024-01-17 DIAGNOSIS — R0789 Other chest pain: Secondary | ICD-10-CM | POA: Diagnosis not present

## 2024-01-17 DIAGNOSIS — R079 Chest pain, unspecified: Secondary | ICD-10-CM | POA: Diagnosis present

## 2024-01-17 LAB — BASIC METABOLIC PANEL
Anion gap: 8 (ref 5–15)
BUN: 9 mg/dL (ref 6–20)
CO2: 25 mmol/L (ref 22–32)
Calcium: 9.1 mg/dL (ref 8.9–10.3)
Chloride: 104 mmol/L (ref 98–111)
Creatinine, Ser: 0.87 mg/dL (ref 0.44–1.00)
GFR, Estimated: >60 mL/min (ref >60)
Glucose, Bld: 99 mg/dL (ref 70–99)
Potassium: 4 mmol/L (ref 3.5–5.1)
Sodium: 137 mmol/L (ref 135–145)

## 2024-01-17 LAB — CBC
HCT: 40.8 % (ref 36.0–46.0)
Hemoglobin: 13.1 g/dL (ref 12.0–15.0)
MCH: 28.5 pg (ref 26.0–34.0)
MCHC: 32.1 g/dL (ref 30.0–36.0)
MCV: 88.9 fL (ref 80.0–100.0)
Platelets: 231 10*3/uL (ref 150–400)
RBC: 4.59 MIL/uL (ref 3.87–5.11)
RDW: 14 % (ref 11.5–15.5)
WBC: 6.7 10*3/uL (ref 4.0–10.5)
nRBC: 0 % (ref 0.0–0.2)

## 2024-01-17 LAB — TROPONIN I (HIGH SENSITIVITY): Troponin I (High Sensitivity): <2 ng/L (ref <18)

## 2024-01-17 MED ORDER — TIZANIDINE HCL 4 MG PO TABS: 4.0000 mg | ORAL_TABLET | Freq: Four times a day (QID) | ORAL | 0 refills | Status: AC | PRN

## 2024-01-17 MED ORDER — DICLOFENAC SODIUM 1 % EX GEL: 4.0000 g | Freq: Four times a day (QID) | CUTANEOUS | 0 refills | Status: AC

## 2024-01-17 NOTE — ED Triage Notes (Signed)
 Patient arrives POV with complaints of ongoing chest pain x4 days. Patient was seen at another ED earlier this week for the same. No relief with prescription meds.

## 2024-01-17 NOTE — ED Provider Notes (Signed)
  EMERGENCY DEPARTMENT AT [facility] Provider Note   CSN: [id number] Arrival date & time: 01/17/24  1334     History  Chief Complaint  Patient presents with   Chest Pain    Tinnie Kunin is a 48 y.o. female PMH anxiety, anemia, GERD, tubal ligation, s/p cholecystectomy, HTN (on valsartan)   Chest Pain Associated symptoms: back pain   Associated symptoms: no abdominal pain, no cough, no fever, no palpitations, no shortness of breath and no vomiting   Total of 4 days of symptoms Recently seen in ER 01/13/2024 for reproducible chest pain-discharged on NSAID regimen (naproxen  375 BID, lidocaine  patches).  She has only been taking half of the naproxen  BID and lidocaine  patches don't feel like it's helping Chest pain located under the ribcage and radiating to the middle of her back The pain is described as sharp, exacerbated by movement and deep breaths, and relieved by finding a comfortable position She denies coughing, fever, and other symptoms No sick symptoms     Home Medications Prior to Admission medications   Medication Sig Start Date End Date Taking? Authorizing Provider  diclofenac  Sodium (VOLTAREN  ARTHRITIS PAIN) 1 % GEL Apply 4 g topically 4 (four) times daily. 01/17/24  Yes Christia Budds, MD  tiZANidine  (ZANAFLEX ) 4 MG tablet Take 1 tablet (4 mg total) by mouth every 6 (six) hours as needed for muscle spasms. 01/17/24  Yes Christia Budds, MD  cetirizine (ZYRTEC) 10 MG tablet Take 10 mg by mouth every other day. Reported on 01/24/2016    [provider]  lidocaine  (SALONPAS PAIN RELIEVING) 4 % Place 1 patch onto the skin every 12 (twelve) hours. 01/13/24   Kommor, Madison, MD  naproxen  (NAPROSYN ) 375 MG tablet Take 1 tablet (375 mg total) by mouth 2 (two) times daily. 01/13/24   Kommor, Lum, MD  valACYclovir  (VALTREX ) 500 MG tablet Take one tablet BID at onset of symptoms for 3 days then as needed 02/25/20   Arnaldo Purchase, MD       Allergies    Patient has no known allergies.    Review of Systems   Review of Systems  Constitutional:  Negative for chills and fever.  HENT:  Negative for ear pain and sore throat.   Eyes:  Negative for pain and visual disturbance.  Respiratory:  Negative for cough and shortness of breath.   Cardiovascular:  Positive for chest pain. Negative for palpitations.  Gastrointestinal:  Negative for abdominal pain and vomiting.  Genitourinary:  Negative for dysuria and hematuria.  Musculoskeletal:  Positive for back pain. Negative for arthralgias.  Skin:  Negative for color change and rash.  Neurological:  Negative for seizures and syncope.  All other systems reviewed and are negative.   Physical Exam Updated Vital Signs BP (!) 140/95   Pulse 86   Temp 98 F (36.7 C) (Temporal)   Resp 20   Ht 5' 9 (1.753 m)   Wt 134 kg   LMP 06/08/2016   SpO2 100%   BMI 43.63 kg/m  Physical Exam Vitals and nursing note reviewed.  Constitutional:      General: She is not in acute distress.    Appearance: She is well-developed.  HENT:     Head: Normocephalic and atraumatic.  Eyes:     Conjunctiva/sclera: Conjunctivae normal.  Cardiovascular:     Rate and Rhythm: Normal rate and regular rhythm.     Heart sounds: No murmur heard. Pulmonary:     Effort: Pulmonary effort is  normal. No respiratory distress.     Breath sounds: Normal breath sounds.  Chest:     Chest wall: Tenderness present. No crepitus.     Comments: Tenderness to palpation on anterior chest wall near sternal region under rib cage worse on right side. Abdominal:     Palpations: Abdomen is soft.     Tenderness: There is no abdominal tenderness.  Musculoskeletal:        General: No swelling.     Cervical back: Neck supple.     Comments: Tenderness to palpation along mid back, no point tenderness on spinal processes  Skin:    General: Skin is warm and dry.     Capillary Refill: Capillary refill takes less than 2 seconds.   Neurological:     Mental Status: She is alert.  Psychiatric:        Mood and Affect: Mood normal.     ED Results / Procedures / Treatments   Labs (all labs ordered are listed, but only abnormal results are displayed) Labs Reviewed  BASIC METABOLIC PANEL  CBC  TROPONIN I (HIGH SENSITIVITY)  TROPONIN I (HIGH SENSITIVITY)    EKG EKG Interpretation Date/Time:  Friday January 17 2024 13:43:04 EST Ventricular Rate:  87 PR Interval:  148 QRS Duration:  74 QT Interval:  440 QTC Calculation: 529 R Axis:   38  Text Interpretation: Normal sinus rhythm Low voltage QRS Nonspecific T wave abnormality No significant change since last tracing Confirmed by Bernard Drivers ([id number]) on 01/17/2024 1:49:11 PM  Radiology DG Chest 2 View Result Date: 01/17/2024 CLINICAL DATA:  Chest pain for several days. EXAM: CHEST - 2 VIEW COMPARISON:  January 13, 2024. FINDINGS: The heart size and mediastinal contours are within normal limits. Both lungs are clear. The visualized skeletal structures are unremarkable. IMPRESSION: No active cardiopulmonary disease. Electronically Signed   By: Lynwood Landy Raddle M.D.   On: 01/17/2024 15:22    Procedures Procedures   Medications Ordered in ED Medications - No data to display  ED Course/ Medical Decision Making/ A&P             HEART Score: 2                    Medical Decision Making Amount and/or Complexity of Data Reviewed Labs: ordered. Radiology: ordered.   Medical Decision Making:   Cataleia Gade is a 48 y.o. female who presented to the ED today with midsternal chest pain and mid back chest pain that is worse with movement detailed above.    External chart has been reviewed including recent ED visit. Patient's presentation is complicated by their history of hypertension.  Complete initial physical exam performed, notably the patient  was tender to palpation on anterior chest wall and worse with movement.    Reviewed and confirmed nursing  documentation for past medical history, family history, social history.    Initial Assessment:   With the patient's presentation of chest/back pain, most likely diagnosis is musculoskeletal in origin. Other diagnoses were considered including (but not limited to) MI (troponin negative), pneumothorax (CXR negative), pulmonary embolism(no hemoptysis, leg swelling, D-dimer recently). These are considered less likely due to history of present illness and physical exam findings.    Initial Plan:  Screening labs including CBC and Metabolic panel to evaluate for infectious or metabolic etiology of disease.  CXR to evaluate for structural/infectious intrathoracic pathology.  EKG and troponin to evaluate for cardiac pathology Objective evaluation as below reviewed  Initial Study Results:   Laboratory  All laboratory results reviewed without evidence of clinically relevant pathology.   Exceptions include:    EKG EKG was reviewed independently. Rate, rhythm, axis, intervals all examined and without medically relevant abnormality. ST segments without concerns for elevations.    Radiology:  All images reviewed independently. Agree with radiology report at this time.   DG Chest 2 View Result Date: 01/17/2024 CLINICAL DATA:  Chest pain for several days. EXAM: CHEST - 2 VIEW COMPARISON:  January 13, 2024. FINDINGS: The heart size and mediastinal contours are within normal limits. Both lungs are clear. The visualized skeletal structures are unremarkable. IMPRESSION: No active cardiopulmonary disease. Electronically Signed   By: Lynwood Landy Raddle M.D.   On: 01/17/2024 15:22   DG Chest 2 View Result Date: 01/13/2024 CLINICAL DATA:  Acute chest pain beginning 1 hour ago. EXAM: CHEST - 2 VIEW COMPARISON:  03/29/2011 FINDINGS: The heart size and mediastinal contours are within normal limits. Both lungs are clear. The visualized skeletal structures are unremarkable. IMPRESSION: No active cardiopulmonary disease.  Electronically Signed   By: Norleen DELENA Kil M.D.   On: 01/13/2024 15:33    Final Assessment and Plan:   48 year old female here for chest pain and mid back pain that is positional in nature.  Has not been taking full dose of anti-inflammatories as previously prescribed.  Low likelihood of dissection, cardiac etiology given workup above.  Most likely musculoskeletal and patient was amenable to treatment with Voltaren  gel topically, maximizing anti-inflammatory medication and adding muscle relaxer as needed.  If not improving recommended physical therapy outpatient and PCP follow-up.   Clinical Impression:  1. Chest wall pain      Discharge   Final Clinical Impression(s) / ED Diagnoses Final diagnoses:  Chest wall pain    Rx / DC Orders ED Discharge Orders          Ordered    diclofenac  Sodium (VOLTAREN  ARTHRITIS PAIN) 1 % GEL  4 times daily        01/17/24 1611    tiZANidine  (ZANAFLEX ) 4 MG tablet  Every 6 hours PRN        01/17/24 1611              Christia Budds, MD 01/17/24 [id number]    Doretha Folks, MD 01/17/24 2312

## 2024-01-17 NOTE — Discharge Instructions (Addendum)
 Your cardiac workup was reassuring that it is not your heart This is most likely musculoskeletal in nature and I recommend taking the full dose of your anti-inflammatory medicine We are prescribing you a Voltaren  gel to do 4 times daily on that area topically I am prescribing a muscle relaxer to help as well -this may make you sleepy so you can start out with taking it at nighttime If not improving I would recommend physical therapy for improvement versus possible massage therapy

## 2024-01-20 ENCOUNTER — Telehealth: Payer: Self-pay

## 2024-01-20 NOTE — Telephone Encounter (Signed)
 Pharmacy Patient Advocate Encounter   Received notification from CoverMyMeds that prior authorization for DICLOFENAC  SODIUM 1% GEL is required/requested.   Insurance verification completed.   The patient is insured through [employer] [facility] .   PA required; PA submitted to above mentioned insurance via CoverMyMeds Key/confirmation #/EOC [id number]. Status is pending

## 2024-01-21 NOTE — Telephone Encounter (Signed)
Pharmacy Patient Advocate Encounter  Received notification from [employer] [facility] that Prior Authorization for DICLOFENAC 1% GEL has been DENIED.  Full denial letter will be uploaded to the media tab. See denial reason below.  Your plan only covers this drug when it is used for certain health conditions. Covered use is for osteoarthritis pain in joints that can be treated topically such as feet, ankles, knees, hands, wrists, or elbows. Your plan does not cover this drug for your health condition that your doctor told us you have.   Brand voltaren gel available OTC  PA #/Case ID/Reference #: [id number]

## 2024-02-24 ENCOUNTER — Telehealth: Payer: Self-pay | Admitting: Diagnostic Neuroimaging

## 2024-02-24 NOTE — Telephone Encounter (Signed)
 Received sleep referral for pt from Dr. Maryelizabeth Rowan. Placed in sleep referrals box

## 2024-04-01 ENCOUNTER — Telehealth: Payer: Self-pay | Admitting: Neurology

## 2024-04-01 NOTE — Telephone Encounter (Signed)
 rs appointment

## 2024-04-02 ENCOUNTER — Institutional Professional Consult (permissible substitution): Admitting: Neurology

## 2024-04-21 ENCOUNTER — Institutional Professional Consult (permissible substitution): Admitting: Neurology

## 2024-05-25 ENCOUNTER — Encounter: Payer: Self-pay | Admitting: Neurology

## 2024-05-25 ENCOUNTER — Ambulatory Visit (INDEPENDENT_AMBULATORY_CARE_PROVIDER_SITE_OTHER): Admitting: Neurology

## 2024-05-25 VITALS — BP 135/91 | HR 85 | Ht 69.0 in | Wt 312.2 lb

## 2024-05-25 DIAGNOSIS — R519 Headache, unspecified: Secondary | ICD-10-CM

## 2024-05-25 DIAGNOSIS — Z8669 Personal history of other diseases of the nervous system and sense organs: Secondary | ICD-10-CM

## 2024-05-25 DIAGNOSIS — R030 Elevated blood-pressure reading, without diagnosis of hypertension: Secondary | ICD-10-CM | POA: Diagnosis not present

## 2024-05-25 DIAGNOSIS — G4719 Other hypersomnia: Secondary | ICD-10-CM

## 2024-05-25 DIAGNOSIS — Z6841 Body Mass Index (BMI) 40.0 and over, adult: Secondary | ICD-10-CM

## 2024-05-25 DIAGNOSIS — R351 Nocturia: Secondary | ICD-10-CM

## 2024-05-25 NOTE — Patient Instructions (Signed)

## 2024-05-25 NOTE — Progress Notes (Signed)
 Subjective:    Patient ID: Tracy Bowen is a 48 y.o. female.  HPI    Debbra Fairy, MD, PhD [facility] [address], [address] [address] [place], [place] [address]  Dear Jullie Oiler,  I saw your patient, Tracy Bowen, upon your kind request in my sleep clinic today for initial consultation of her sleep disorder, in particular, concern for underlying obstructive sleep apnea.  The patient is unaccompanied today.  As you know, Tracy Bowen is a 48 year old female with an underlying medical history of anemia, arthritis, anxiety, depression, history of kidney stones, reflux disease, allergies, and obesity, who reports snoring and excessive daytime somnolence.  Her Epworth sleepiness score is 17 out of 24, fatigue severity score is 51 out of 63.  She was previously diagnosed with obstructive sleep apnea several years ago and tried PAP therapy but turned the machine back in.  She had trouble tolerating the nasal interface.  She has not been on treatment for years, prior sleep study results are not available for my review today.  I reviewed your office note from 02/20/2024.  She has had weight gain over time and is working on weight loss currently.  She is not aware of any family history of sleep apnea.  She is single and lives with her 63 year old daughter who is about to start college in [place].  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  She is a high Engineer, site, teaches English.  She has a bedtime around 1 or 2 AM, rise time around 7:30 AM or 7:45 AM.  She has nocturia once on average, she has occasional morning headaches.  She is a non-smoker and does not utilize any alcohol, she drinks caffeine in the form of coffee, usually 1 cup/day.  They have 1 dog in the household and the dog does not sleep in her bedroom.  She does have a TV in her bedroom and it is typically on a quiet screen.  She does not like to  sleep in the complete dark.  Her Past Medical History Is Significant For: Past Medical History:  Diagnosis Date   Anemia    Anxiety    Arthritis    Depression    Headache    History of kidney stones    HSV-2 infection    Leiomyoma 09/2012   Mild acid reflux WATCHES DIET   Seasonal allergies     Her Past Surgical History Is Significant For: Past Surgical History:  Procedure Laterality Date   ABDOMINAL HYSTERECTOMY N/A 10/16/2016   Procedure: HYSTERECTOMY ABDOMINAL;  Surgeon: Lacretia Piccolo, MD;  Location: [facility];  Service: Gynecology;  Laterality: N/A;  request to follow around 10:30am.  Needs two hours OR time.   BILATERAL SALPINGECTOMY Bilateral 10/16/2016   Procedure: BILATERAL SALPINGECTOMY;  Surgeon: Lacretia Piccolo, MD;  Location: [facility];  Service: Gynecology;  Laterality: Bilateral;   DILATION AND CURETTAGE OF UTERUS  1997   W/ SUCTION   INDUCED ABORTION  2002   KNEE ARTHROSCOPY W/ ACL RECONSTRUCTION AND PATELLA GRAFT  02-15-2000   RIGHT KNEE  W/ POST DRAIN REMOVAL 02-16-2000   LAPAROSCOPIC CHOLECYSTECTOMY  10-23-2007   TUBAL LIGATION  10/03/2012   Procedure: BILATERAL TUBAL LIGATION;  Surgeon: Lacretia Piccolo, MD;  Location: [facility];  Service: Gynecology;  Laterality: Bilateral;  Laparoscopic Tubal Ligation with Fallope Rings.  2nd choice is 10/10/12 at 7:30am.  Her Family History Is Significant For: Family History  Problem Relation Age of Onset   Hypertension Mother    Dementia Mother    Cancer Maternal Grandmother        LUNG CANCER   Diabetes Paternal Grandmother     Her Social History Is Significant For: Social History   Socioeconomic History   Marital status: Single    Spouse name: Not on file   Number of children: 1   Years of education: Not on file   Highest education level: Not on file  Occupational History   Occupation: Consulting civil engineer   Tobacco Use   Smoking status: Never   Smokeless tobacco: Never  Vaping Use    Vaping status: Never Used  Substance and Sexual Activity   Alcohol use: No    Alcohol/week: 0.0 standard drinks of alcohol   Drug use: No   Sexual activity: Yes    Birth control/protection: Surgical    Comment: Hyst-48 yo-More than 5 partners  Other Topics Concern   Not on file  Social History Narrative   Single.  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  Lives with a family friend.   Social Drivers of Corporate investment banker Strain: Not on file  Food Insecurity: Not on file  Transportation Needs: Not on file  Physical Activity: Not on file  Stress: Not on file  Social Connections: Not on file    Her Allergies Are:  No Known Allergies:   Her Current Medications Are:  Outpatient Encounter Medications as of 05/25/2024  Medication Sig   cetirizine (ZYRTEC) 10 MG tablet Take 10 mg by mouth every other day. Reported on 01/24/2016   diclofenac  Sodium (VOLTAREN  ARTHRITIS PAIN) 1 % GEL Apply 4 g topically 4 (four) times daily.   lidocaine  (SALONPAS PAIN RELIEVING) 4 % Place 1 patch onto the skin every 12 (twelve) hours.   naproxen  (NAPROSYN ) 375 MG tablet Take 1 tablet (375 mg total) by mouth 2 (two) times daily.   tiZANidine  (ZANAFLEX ) 4 MG tablet Take 1 tablet (4 mg total) by mouth every 6 (six) hours as needed for muscle spasms.   valACYclovir  (VALTREX ) 500 MG tablet Take one tablet BID at onset of symptoms for 3 days then as needed   No facility-administered encounter medications on file as of 05/25/2024.  :   Review of Systems:  Out of a complete 14 point review of systems, all are reviewed and negative with the exception of these symptoms as listed below:   Review of Systems  Neurological:        Pt here sleep consult Pt snores, few headaches,fatigue,hypertension Pt states had sleep study 10+ years ago and did have pap machine but turned it back in      ESS:17 FSS:51     Objective:  Neurological Exam  Physical Exam Physical Examination:   Vitals:    05/25/24 1252  BP: (!) 135/91  Pulse: 85    General Examination: The patient is a very pleasant 48 y.o. female in no acute distress. She appears well-developed and well-nourished and well groomed.   HEENT: Normocephalic, atraumatic, pupils are equal, round and reactive to light, extraocular tracking is good without limitation to gaze excursion or nystagmus noted. Hearing is grossly intact. Face is symmetric with normal facial animation. Speech is clear with no dysarthria noted. There is no hypophonia. There is no lip, neck/head, jaw or voice tremor. Neck is supple with full range of passive and active motion. There are no carotid  bruits on auscultation. Oropharynx exam reveals: No significant mouth dryness, good dental hygiene, moderate airway crowding secondary to Mallampati class IV, thicker soft palate, wider tongue.  Tonsils about 1+ bilaterally.  Tongue protrudes centrally and palate elevates symmetrically, neck circumference 16 three-quarter inches, minimal to mild overbite noted.   Chest: Clear to auscultation without wheezing, rhonchi or crackles noted.  Heart: S1+S2+0, regular and normal without murmurs, rubs or gallops noted.   Abdomen: Soft, non-tender and non-distended.  Extremities: There is nonpitting puffiness in both ankles, right ankle a little wider compared to left.    Skin: Warm and dry without trophic changes noted.   Musculoskeletal: exam reveals no obvious joint deformities.   Neurologically:  Mental status: The patient is awake, alert and oriented in all 4 spheres. Her immediate and remote memory, attention, language skills and fund of knowledge are appropriate. There is no evidence of aphasia, agnosia, apraxia or anomia. Speech is clear with normal prosody and enunciation. Thought process is linear. Mood is normal and affect is normal.  Cranial nerves II - XII are as described above under HEENT exam.  Motor exam: Normal bulk, strength and tone is noted. There is no  obvious action or resting tremor.  Fine motor skills and coordination: grossly intact.  Cerebellar testing: No dysmetria or intention tremor. There is no truncal or gait ataxia.  Sensory exam: intact to light touch in the upper and lower extremities.  Gait, station and balance: She stands easily. No veering to one side is noted. No leaning to one side is noted. Posture is age-appropriate and stance is narrow based. Gait shows normal stride length and normal pace. No problems turning are noted.   Assessment and Plan:  In summary, Tracy Bowen is a very pleasant 48 year old female with an underlying medical history of anemia, arthritis, anxiety, depression, history of kidney stones, reflux disease, allergies, and obesity, whose history and physical exam are concerning for sleep disordered breathing, particularly obstructive sleep apnea (OSA). While a laboratory attended sleep study is typically considered gold standard for evaluation of sleep disordered breathing, we mutually agreed to proceed with a home sleep test at this time for reevaluation and particularly because she is in the process of moving to [place].   I had a long chat with the patient about my findings and the diagnosis of sleep apnea, particularly OSA, its prognosis and treatment options. We talked about medical/conservative treatments, surgical interventions and non-pharmacological approaches for symptom control. I explained, in particular, the risks and ramifications of untreated moderate to severe OSA, especially with respect to developing cardiovascular disease down the road, including congestive heart failure (CHF), difficult to treat hypertension, cardiac arrhythmias (particularly A-fib), neurovascular complications including TIA, stroke and dementia. Even type 2 diabetes has, in part, been linked to untreated OSA. Symptoms of untreated OSA may include (but may not be limited to) daytime sleepiness, nocturia (i.e. frequent  nighttime urination), memory problems, mood irritability and suboptimally controlled or worsening mood disorder such as depression and/or anxiety, lack of energy, lack of motivation, physical discomfort, as well as recurrent headaches, especially morning or nocturnal headaches. We talked about the importance of maintaining a healthy lifestyle and striving for healthy weight. In addition, we talked about the importance of striving for and maintaining good sleep hygiene. I recommended a sleep study at this time. I outlined the differences between a laboratory attended sleep study which is considered more comprehensive and accurate over the option of a home sleep test (HST); the latter may  lead to underestimation of sleep disordered breathing in some instances and does not help with diagnosing upper airway resistance syndrome and is not accurate enough to diagnose primary central sleep apnea typically. I outlined possible surgical and non-surgical treatment options of OSA, including the use of a positive airway pressure (PAP) device (i.e. CPAP, AutoPAP/APAP or BiPAP in certain circumstances), a custom-made dental device (aka oral appliance, which would require a referral to a specialist dentist or orthodontist typically, and is generally speaking not considered for patients with full dentures or edentulous state), upper airway surgical options, such as traditional UPPP (which is not considered a first-line treatment) or the Inspire device (hypoglossal nerve stimulator, which would involve a referral for consultation with an ENT surgeon, after careful selection, following inclusion criteria - also not first-line treatment). I explained the PAP treatment option to the patient in detail, as this is generally considered first-line treatment.  The patient indicated that she would be willing to try PAP therapy, if the need arises. I explained the importance of being compliant with PAP treatment, not only for insurance  purposes but primarily to improve patient's symptoms symptoms, and for the patient's long term health benefit, including to reduce Her cardiovascular risks longer-term.    We will pick up our discussion about the next steps and treatment options after testing.  We will keep her posted as to the test results by phone call and/or MyChart messaging where possible.  We will plan to follow-up in sleep clinic accordingly as well.  I answered all her questions today and the patient was in agreement.   I encouraged her to call with any interim questions, concerns, problems or updates or email us  through MyChart.  Generally speaking, sleep test authorizations may take up to 2 weeks, sometimes less, sometimes longer, the patient is encouraged to get in touch with us  if they do not hear back from the sleep lab staff directly within the next 2 weeks.  Thank you very much for allowing me to participate in the care of this nice patient. If I can be of any further assistance to you please do not hesitate to call me at [phone].  Sincerely,   Debbra Fairy, MD, PhD

## 2024-06-09 ENCOUNTER — Ambulatory Visit: Admitting: Neurology

## 2024-06-09 DIAGNOSIS — Z8669 Personal history of other diseases of the nervous system and sense organs: Secondary | ICD-10-CM

## 2024-06-09 DIAGNOSIS — G4733 Obstructive sleep apnea (adult) (pediatric): Secondary | ICD-10-CM

## 2024-06-09 DIAGNOSIS — R030 Elevated blood-pressure reading, without diagnosis of hypertension: Secondary | ICD-10-CM

## 2024-06-09 DIAGNOSIS — R519 Headache, unspecified: Secondary | ICD-10-CM

## 2024-06-09 DIAGNOSIS — G4719 Other hypersomnia: Secondary | ICD-10-CM

## 2024-06-09 DIAGNOSIS — R351 Nocturia: Secondary | ICD-10-CM

## 2024-06-10 ENCOUNTER — Ambulatory Visit: Payer: Self-pay | Admitting: Neurology

## 2024-06-10 DIAGNOSIS — G4733 Obstructive sleep apnea (adult) (pediatric): Secondary | ICD-10-CM

## 2024-06-10 NOTE — Procedures (Signed)
   [facility]  HOME SLEEP TEST (Watch PAT) REPORT  STUDY DATE: 06/09/2024  DOB: [birth year]  MRN: [id number]  ORDERING CLINICIAN: True Mar, MD, PhD   REFERRING CLINICIAN: Waylan Almarie SAUNDERS, MD   CLINICAL INFORMATION/HISTORY: 48 year old female with an underlying medical history of anemia, arthritis, anxiety, depression, history of kidney stones, reflux disease, allergies, and obesity, who reports snoring and excessive daytime somnolence.   Epworth sleepiness score: 17/24.  BMI: 46.1 kg/m  FINDINGS:   Sleep Summary:   Total Recording Time (hours, min): 8 hours, 26 min  Total Sleep Time (hours, min):  7 hours, 46 min  Percent REM (%):    18.7%   Respiratory Indices:   Calculated pAHI (per hour):  40.4/hour         REM pAHI:    65.8/hour       NREM pAHI: 34.7/hour  Central pAHI: 0.9/hour  Oxygen Saturation Statistics:    Oxygen Saturation (%) Mean: 95%   Minimum oxygen saturation (%):                 76%   O2 Saturation Range (%): 76-100%    O2 Saturation (minutes) <=88%: 9.4 min  Pulse Rate Statistics:   Pulse Mean (bpm):    92/min    Pulse Range (72-110/min)   IMPRESSION: OSA (obstructive sleep apnea), severe   RECOMMENDATION:  This home sleep test demonstrates severe obstructive sleep apnea with a total AHI of 40.4/hour and O2 nadir of 76%. Snoring was detected, in the mild to loud range.  Treatment with positive airway pressure is highly recommended. The patient will be advised to proceed with an autoPAP titration/trial at home. A laboratory attended titration study can be considered in the future for optimization of treatment settings and to improve tolerance and compliance, if needed, down the road. Alternative treatment options are limited secondary to the severity of the patient's sleep disordered breathing, but may include surgical treatment with an implantable hypoglossal nerve stimulator (in carefully selected candidates,  meeting criteria).  Concomitant weight loss is recommended (where clinically appropriate). Please note, that untreated obstructive sleep apnea may carry additional perioperative morbidity. Patients with significant obstructive sleep apnea should receive perioperative PAP therapy and the surgeons and particularly the anesthesiologist should be informed of the diagnosis and the severity of the sleep disordered breathing. The patient should be cautioned not to drive, work at heights, or operate dangerous or heavy equipment when tired or sleepy. Review and reiteration of good sleep hygiene measures should be pursued with any patient. Other causes of the patient's symptoms, including circadian rhythm disturbances, an underlying mood disorder, medication effect and/or an underlying medical problem cannot be ruled out based on this test. Clinical correlation is recommended.  The patient and her referring provider will be notified of the test results. The patient will be seen in follow up in sleep clinic at [facility].  I certify that I have reviewed the raw data recording prior to the issuance of this report in accordance with the standards of the American Academy of Sleep Medicine (AASM).    INTERPRETING PHYSICIAN:   True Mar, MD, PhD Medical Director, [facility]) Diplomat, ABPN (Neurology and Sleep)   [facility] [address] [place], [place] [address] [phone]

## 2024-06-10 NOTE — Progress Notes (Signed)
 See procedure note.

## 2024-06-11 NOTE — Telephone Encounter (Signed)
 Called pt and relayed results of severe OSA urgent set up.  Dr. Buck recommended autopap. Pt has moved to [place] and will try DME [facility].  (She did look up and they had [facility] on [address] [phone]).  Sent message to [facility] with this info.  She is to wear 4hr or more every night for insurance compliance.  Appt made with Jessia Mccue NP for initial pap f/u 09-02-2024 at 1545 15 min VV.  Pt verbalized understanding of process.  Will look for call from DME in 72hr up to week for authorization information and then set up.  Pt understood plan and will call back if does hear back.

## 2024-06-11 NOTE — Telephone Encounter (Signed)
-----   Message from True Mar sent at 06/10/2024  5:30 PM EDT ----- Urgent set up requested on PAP therapy, due to severe OSA.   Patient referred by PCP, seen by me on 05/25/2024, patient had a HST on 06/09/2024.    Please call and notify the patient that the recent home sleep test showed obstructive sleep apnea in the severe range. I recommend treatment for this in the form of autoPAP, which means, that we  don't have to bring her in for a sleep study with CPAP, but will let her start using a so called autoPAP machine at home, through a DME company (of her choice, or as per insurance requirement). The  DME representative will fit the patient with a mask of choice, educate her on how to use the machine, how to put the mask on, etc. I have placed an order in the chart. Please send the order to a  local DME, talk to patient, send report to referring MD. Please also reinforce the need for compliance with treatment. We will need a FU in sleep clinic for 10 weeks post-PAP set up, please arrange  that with me or one of our NPs. Thanks,   True Mar, MD, PhD [facility])    ----- Message ----- From: Mar True, MD Sent: 06/10/2024   5:28 PM EDT To: True Mar, MD

## 2024-06-11 NOTE — Telephone Encounter (Signed)
 RE: urgent set up  SEVERE osa Received: Today New, Adine Neysa Nena GORMAN, RN; Joylene Carlean Sheree Leveda Viktoria Dortha Jackson Avelina; 1 other Received, thank you!     Previous Messages    ----- Message ----- From: Neysa Nena GORMAN, RN Sent: 06/11/2024   9:52 AM EDT To: Adine Joylene; Avelina Jackson; Ephraim Viktoria* Subject: urgent set up  SEVERE osa                      New order in EPIC.  Pt lives in [place], [place]    She looked up DME [facility] on [address].  [phone]  if that works.   Harlene SAUNDERS. [place] Female, 48 y.o., 10-26-1976 Pronouns: she/her/hers MRN: [id number] Phone: [phone] (M)   She states she has togo insurance primary,  [place] R

## 2024-06-17 NOTE — Telephone Encounter (Signed)
 Pt called to follow up on CPAP machine getting to her . Pt states she has not heard back from anyone  and just wanted to follow up on when will she receive Machine

## 2024-09-02 ENCOUNTER — Telehealth: Admitting: Adult Health

## 2024-09-02 ENCOUNTER — Encounter: Payer: Self-pay | Admitting: Adult Health

## 2024-09-02 DIAGNOSIS — G4733 Obstructive sleep apnea (adult) (pediatric): Secondary | ICD-10-CM

## 2024-09-02 NOTE — Progress Notes (Signed)
 [facility] [address] [place].  [address] [phone]       OFFICE FOLLOW UP NOTE  Tracy Bowen Bowen Date of Birth:  10-17-1976 Medical Record Number:  [id number]    Primary neurologist: Dr. Buck Reason for visit: Initial CPAP follow-up  Virtual Visit via Video Note  I connected with Tracy Bowen Bowen on 09/02/24 at  3:45 PM EDT by a video enabled telemedicine application and verified that I am speaking with the correct person using two identifiers.  Location: Patient: at work, in [employer] Provider: in office, [facility]   I discussed the limitations of evaluation and management by telemedicine and the availability of in person appointments. The patient expressed understanding and agreed to proceed.    SUBJECTIVE:   Follow-up visit:  Prior visit: 05/25/2024  Brief HPI:   Tracy Bowen Bowen is a 48 y.o. female who is evaluated by Dr. Buck in 05/2024 for concern of underlying sleep apnea with complaints of snoring and excessive daytime somnolence.  ESS 17/24.  Reports prior diagnosis of OSA several years back but had difficulty tolerating CPAP therapy, notes weight gain since that time.  HST 06/2024 showed severe OSA with total AHI of 40.4/h with O2 nadir of 76%.  AutoPap therapy initiated 06/22/2024.     Interval history:  Patient is being seen for initial CPAP compliance visit.  Reports she was initially doing well with CPAP therapy and tolerating F30i mask without difficulty but has been having greater difficulty over the past couple weeks getting a good seal. She has been having to adjust her straps more due to leaks which has been interfering with her sleep.  She does report a 10 to 12 pound weight loss since starting CPAP to begin changes in her face.  She does note great improvement in energy levels and sleep quality since starting CPAP. ESS 2/24, prior 17/24.        ROS:   14 system review of systems performed and negative with  exception of those listed in HPI  PMH:  Past Medical History:  Diagnosis Date   Anemia    Anxiety    Arthritis    Depression    Headache    History of kidney stones    HSV-2 infection    Leiomyoma 09/2012   Mild acid reflux WATCHES DIET   Seasonal allergies     PSH:  Past Surgical History:  Procedure Laterality Date   ABDOMINAL HYSTERECTOMY N/A 10/16/2016   Procedure: HYSTERECTOMY ABDOMINAL;  Surgeon: Evalene SHAUNNA Organ, MD;  Location: [facility];  Service: Gynecology;  Laterality: N/A;  request to follow around 10:30am.  Needs two hours OR time.   BILATERAL SALPINGECTOMY Bilateral 10/16/2016   Procedure: BILATERAL SALPINGECTOMY;  Surgeon: Evalene SHAUNNA Organ, MD;  Location: [facility];  Service: Gynecology;  Laterality: Bilateral;   DILATION AND CURETTAGE OF UTERUS  1997   W/ SUCTION   INDUCED ABORTION  2002   KNEE ARTHROSCOPY W/ ACL RECONSTRUCTION AND PATELLA GRAFT  02-15-2000   RIGHT KNEE  W/ POST DRAIN REMOVAL 02-16-2000   LAPAROSCOPIC CHOLECYSTECTOMY  10-23-2007   TUBAL LIGATION  10/03/2012   Procedure: BILATERAL TUBAL LIGATION;  Surgeon: Evalene SHAUNNA Organ, MD;  Location: [facility];  Service: Gynecology;  Laterality: Bilateral;  Laparoscopic Tubal Ligation with Fallope Rings.  2nd choice is 10/10/12 at 7:30am.    Social History:  Social History   Socioeconomic History   Marital status: Single    Spouse name: Not on file  Number of children: 1   Years of education: Not on file   Highest education level: Not on file  Occupational History   Occupation: Student   Tobacco Use   Smoking status: Never   Smokeless tobacco: Never  Vaping Use   Vaping status: Never Used  Substance and Sexual Activity   Alcohol use: No    Alcohol/week: 0.0 standard drinks of alcohol   Drug use: No   Sexual activity: Yes    Birth control/protection: Surgical    Comment: Hyst-48 yo-More than 5 partners  Other Topics Concern   Not on file  Social History Narrative    Single.  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  Lives with a family friend.   Social Drivers of Corporate investment banker Strain: Not on file  Food Insecurity: Not on file  Transportation Needs: Not on file  Physical Activity: Not on file  Stress: Not on file  Social Connections: Not on file  Intimate Partner Violence: Not on file    Family History:  Family History  Problem Relation Age of Onset   Hypertension Mother    Dementia Mother    Cancer Maternal Grandmother        LUNG CANCER   Diabetes Paternal Grandmother     Medications:   Current Outpatient Medications on File Prior to Visit  Medication Sig Dispense Refill   cetirizine (ZYRTEC) 10 MG tablet Take 10 mg by mouth every other day. Reported on 01/24/2016     diclofenac  Sodium (VOLTAREN  ARTHRITIS PAIN) 1 % GEL Apply 4 g topically 4 (four) times daily. 350 g 0   lidocaine  (SALONPAS PAIN RELIEVING) 4 % Place 1 patch onto the skin every 12 (twelve) hours. 15 patch 0   naproxen  (NAPROSYN ) 375 MG tablet Take 1 tablet (375 mg total) by mouth 2 (two) times daily. 20 tablet 0   tiZANidine  (ZANAFLEX ) 4 MG tablet Take 1 tablet (4 mg total) by mouth every 6 (six) hours as needed for muscle spasms. 20 tablet 0   valACYclovir  (VALTREX ) 500 MG tablet Take one tablet BID at onset of symptoms for 3 days then as needed 30 tablet 0   No current facility-administered medications on file prior to visit.    Allergies:  No Known Allergies    OBJECTIVE:  Physical Exam  General: well developed, well nourished, seated, in no evident distress  Neurologic Exam Mental Status: Awake and fully alert. Oriented to place and time. Recent and remote memory intact. Attention span, concentration and fund of knowledge appropriate. Mood and affect appropriate.         ASSESSMENT/PLAN: Tracy Bowen Bowen is a 48 y.o. year old female    OSA on CPAP :  Compliance report shows satisfactory usage with optimal residual AHI.    Will place order requesting mask refitting due to difficulty getting a good seal past couple of weeks. She does endorse weight loss since starting, unsure if contributing Continue current pressure settings of 6-12 with EPR 3 Discussed continued nightly usage with ensuring greater than 4 hours nightly for optimal benefit and per insurance purposes.   Continue to follow with DME company [employer] for any needed supplies or CPAP related concerns CPAP set up 06/2024     Follow up in 1 year via MyChart video visit or call earlier if needed   CC:  PCP: Waylan Almarie SAUNDERS, MD    I personally spent a total of 25 minutes in the care of the patient  today including preparing to see the patient, getting/reviewing separately obtained history, counseling and educating, placing orders, and documenting clinical information in the EHR. This is our first time meeting and time has been spent reviewing past medical history and relevant medical records.   Tracy Bowen Bogaert, AGNP-BC  [facility] [address] [place], [place] [address]  Phone [phone] Fax [phone] Note: This document was prepared with digital dictation and possible smart phrase technology. Any transcriptional errors that result from this process are unintentional.

## 2024-09-02 NOTE — Patient Instructions (Signed)
 Your Plan:  You will be contacted by your DME company [employer] to schedule visit for mask refitting  Continue nightly use of CPAP with greater than 4 hours per night for optimal benefit and per insurance requirements  Please talk to your DME provider about getting replacement supplies on a regular basis. Please be sure to change your filter every month, your mask about every 3 months, hose about every 6 months, humidifier chamber about yearly. Some restrictions are imposed by your insurance carrier with regard to how frequently you can get certain supplies. Your DME company can provide further details if necessary.      Follow-up in 1 year or call earlier if needed     Thank you for coming to see us  at [facility]. I hope we have been able to provide you high quality care today.  You may receive a patient satisfaction survey over the next few weeks. We would appreciate your feedback and comments so that we may continue to improve ourselves and the health of our patients.
